# Patient Record
Sex: Male | Born: 1995
Health system: Southern US, Community
[De-identification: ages and names within clinical notes are randomized; demographics above are authoritative.]

---
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
## 2016-04-19 ENCOUNTER — Ambulatory Visit (INDEPENDENT_AMBULATORY_CARE_PROVIDER_SITE_OTHER): Payer: 59 | Admitting: Family Medicine

## 2016-04-19 ENCOUNTER — Encounter: Payer: Self-pay | Admitting: Family Medicine

## 2016-04-19 VITALS — BP 124/82 | Wt 212.2 lb

## 2016-04-19 DIAGNOSIS — Z0000 Encounter for general adult medical examination without abnormal findings: Secondary | ICD-10-CM

## 2016-04-19 NOTE — Progress Notes (Signed)
   Subjective:    Patient ID: Henry Hughes, male    DOB: 1995/07/31, 20 y.o.   MRN: [id number]  HPI   [place] saw dr Gerarda Fraction in the past  Pt working on college, graduated over the summer  Pt thinking about the [employer] or [employer]  Pt has family in the [employer]  Pt gets bad seasonal allergies  Into spring and into fall, when eweather changing wrse  Has not used inhaler for years, not a flu shot [employer]  [facility] [place] [facility] [employer] three to four times per week, does cardio   No h s sports   States diet not the greatest  Not really the best Nonsmoker No alcohol intake     Patient arrives to get established. Patient reports no concerns or problems.  Review of Systems  All other systems reviewed and are negative.      Objective:   Physical Exam  Constitutional: He appears well-developed and well-nourished.  Patient is mildly overweight  HENT:  Head: Normocephalic and atraumatic.  Right Ear: External ear normal.  Left Ear: External ear normal.  Nose: Nose normal.  Mouth/Throat: Oropharynx is clear and moist.  Eyes: EOM are normal. Pupils are equal, round, and reactive to light.  Neck: Normal range of motion. Neck supple. No thyromegaly present.  Cardiovascular: Normal rate, regular rhythm and normal heart sounds.   No murmur heard. Pulmonary/Chest: Effort normal and breath sounds normal. No respiratory distress. He has no wheezes.  Abdominal: Soft. Bowel sounds are normal. He exhibits no distension and no mass. There is no tenderness.  Genitourinary: Penis normal.  Musculoskeletal: Normal range of motion. He exhibits no edema.  Lymphadenopathy:    He has no cervical adenopathy.  Neurological: He is alert. He exhibits normal muscle tone.  Skin: Skin is warm and dry. No erythema.  Psychiatric: He has a normal mood and affect. His behavior is normal. Judgment normal.  Vitals reviewed.         Assessment & Plan:  Impression 1 well adult exam #2  overweight discussed including diet and exercise #3 allergic rhinitis with use of Zyrtec when necessary handling symptoms well #4 history of childhood asthma has not required inhaler for years plan patient declines flu shot. Diet and exercise discussed. Old records. WSL

## 2017-02-24 ENCOUNTER — Ambulatory Visit (INDEPENDENT_AMBULATORY_CARE_PROVIDER_SITE_OTHER): Payer: 59 | Admitting: Nurse Practitioner

## 2017-02-24 ENCOUNTER — Encounter: Payer: Self-pay | Admitting: Family Medicine

## 2017-02-24 ENCOUNTER — Encounter: Payer: Self-pay | Admitting: Nurse Practitioner

## 2017-02-24 VITALS — BP 130/68 | Temp 98.4°F | Ht 67.0 in | Wt 206.0 lb

## 2017-02-24 DIAGNOSIS — B369 Superficial mycosis, unspecified: Secondary | ICD-10-CM | POA: Diagnosis not present

## 2017-02-24 DIAGNOSIS — J029 Acute pharyngitis, unspecified: Secondary | ICD-10-CM

## 2017-02-24 LAB — POCT RAPID STREP A (OFFICE): Rapid Strep A Screen: NEGATIVE

## 2017-02-24 MED ORDER — KETOCONAZOLE 2 % EX CREA: 1.0000 "application " | TOPICAL_CREAM | Freq: Two times a day (BID) | CUTANEOUS | 4 refills | Status: DC

## 2017-02-24 MED ORDER — AZITHROMYCIN 250 MG PO TABS: ORAL_TABLET | ORAL | 0 refills | Status: DC

## 2017-02-25 ENCOUNTER — Encounter: Payer: Self-pay | Admitting: Nurse Practitioner

## 2017-02-25 LAB — PLEASE NOTE

## 2017-02-25 LAB — STREP A DNA PROBE: STREP GP A DIRECT, DNA PROBE: NEGATIVE

## 2017-02-25 NOTE — Progress Notes (Signed)
Subjective:  Presents for c/o white areas on the throat first noticed this am. Sore throat. No fever. No N/V. No urinary symptoms. Taking fluids well. No unusual fatigue. No abdominal pain. Has a slight rash in the mid arm area and axillary bilat. Has applied OTC hydrocortisone cream. No mouth or tongue lesions.   Objective:   BP 130/68   Temp 98.4 F (36.9 C) (Oral)   Ht 5\' 7"  (1.702 m)   Wt 206 lb (93.4 kg)   BMI 32.26 kg/m  NAD. Alert, oriented. TMs mild clear effusion. Pharynx mild erythema with multiple small pockets of exudate posterior. Neck supple with mild anterior adenopathy. No posterior adenopathy. Lungs clear. Heart RRR. Abdomen soft, non distended, non tender. No obvious splenomegaly. Dark pink minimally raised well defined patches noted in the flexor surfaces antecubital area. Pink papular rash along the outer part of the right axillary area with that fades toward the center consistent with fungal infection. Smaller area on the left.   Assessment:  Exudative pharyngitis - Plan: Strep A DNA probe, POCT rapid strep A  Fungal skin infection    Plan:   Meds ordered this encounter  Medications  . ketoconazole (NIZORAL) 2 % cream    Sig: Apply 1 application topically 2 (two) times daily.    Dispense:  30 g    Refill:  4    Order Specific Question:   Supervising Provider    Answer:   Mikey Kirschner [[id number]]  . azithromycin (ZITHROMAX Z-PAK) 250 MG tablet    Sig: Take 2 tablets (500 mg) on  Day 1,  followed by 1 tablet (250 mg) once daily on Days 2 through 5.    Dispense:  6 each    Refill:  0    Order Specific Question:   Supervising Provider    Answer:   Mikey Kirschner [[id number]]   Continue HC cream. Add ketoconazole. Warning sings reviewed. Call back in 72 hours if no improvement, sooner if worse.

## 2017-03-01 ENCOUNTER — Other Ambulatory Visit: Payer: Self-pay | Admitting: Nurse Practitioner

## 2017-03-01 MED ORDER — CEFDINIR 300 MG PO CAPS: 300.0000 mg | ORAL_CAPSULE | Freq: Two times a day (BID) | ORAL | 0 refills | Status: DC

## 2017-03-22 ENCOUNTER — Ambulatory Visit: Payer: 59 | Admitting: Nurse Practitioner

## 2017-03-24 ENCOUNTER — Encounter: Payer: Self-pay | Admitting: Family Medicine

## 2017-03-24 ENCOUNTER — Ambulatory Visit (INDEPENDENT_AMBULATORY_CARE_PROVIDER_SITE_OTHER): Payer: 59 | Admitting: Family Medicine

## 2017-03-24 VITALS — Temp 98.2°F | Ht 67.0 in | Wt 205.6 lb

## 2017-03-24 DIAGNOSIS — J029 Acute pharyngitis, unspecified: Secondary | ICD-10-CM | POA: Diagnosis not present

## 2017-03-24 MED ORDER — CLARITHROMYCIN 500 MG PO TABS: 500.0000 mg | ORAL_TABLET | Freq: Two times a day (BID) | ORAL | 0 refills | Status: DC

## 2017-03-24 MED ORDER — MAGIC MOUTHWASH: 15.0000 mL | Freq: Four times a day (QID) | ORAL | 0 refills | Status: DC

## 2017-03-24 NOTE — Progress Notes (Signed)
   Subjective:    Patient ID: Henry Hughes, male    DOB: [birth year], 21 y.o.   MRN: [id number]  HPI Patient arrives with continued sore throat for a month.  Patient was seen 02/24/17 and was strep negative. Patient states he has been on 2 different antibiotics and it has not helped his symptoms.  zpk didn't seem to help any  Was started on another abx  Then stopped for awhile, and then resumed it  Does not smoke   Going to [employer]   Has not missed work ongoing      Review of Systems     Objective:   Physical Exam        Assessment & Plan:

## 2017-03-24 NOTE — Addendum Note (Signed)
Addended by: Dairl Ponder on: 03/24/2017 02:58 PM   Modules accepted: Orders

## 2017-03-31 ENCOUNTER — Encounter: Payer: Self-pay | Admitting: Family Medicine

## 2017-07-13 ENCOUNTER — Telehealth: Payer: Self-pay | Admitting: Family Medicine

## 2017-07-13 NOTE — Telephone Encounter (Signed)
Patient is requesting order for labs.  He said Dr. Richardson Landry wanted him to have this done.

## 2017-07-13 NOTE — Telephone Encounter (Signed)
good

## 2017-07-13 NOTE — Telephone Encounter (Signed)
Left message to return call to get more info and what type of bloodwork he was requesting.  Pt last seen 10/19 for sore throat and has not had labs done since epic

## 2017-07-13 NOTE — Telephone Encounter (Signed)
Pt states his tonsils are inflammed and throat is red. Had this same problem in 0ct and dr Richardson Landry wanted to do bloodwork then but pt declined. Pt is now wanting to do bloodwork. Advised pt that he needs office visit. appt made for tomorrow.

## 2017-07-14 ENCOUNTER — Ambulatory Visit (INDEPENDENT_AMBULATORY_CARE_PROVIDER_SITE_OTHER): Payer: Self-pay | Admitting: Family Medicine

## 2017-07-14 ENCOUNTER — Encounter: Payer: Self-pay | Admitting: Family Medicine

## 2017-07-14 VITALS — Temp 97.6°F | Ht 67.0 in | Wt 205.0 lb

## 2017-07-14 DIAGNOSIS — M79601 Pain in right arm: Secondary | ICD-10-CM

## 2017-07-14 DIAGNOSIS — J351 Hypertrophy of tonsils: Secondary | ICD-10-CM

## 2017-07-14 NOTE — Progress Notes (Signed)
   Subjective:    Patient ID: Henry Hughes, male    DOB: Dec 11, 1995, 22 y.o.   MRN: [id number]  HPI Patient arrives with continued swollen tonsils since October. Patient has tried antibiotics  Took last tound of abx, semed to get etter  Throat still looks different , but pt overall feels better  Pt has girlfriend with similar throat problem.  He wonders if he may have an infection.  No pain no fever no trouble swallowing no difficulties just concerned about the appearance of his tonsils.  States they have looked this way only in the past year  Right arm pain right lateral arm worse with certain motions doing a lot of weight lifting  Review of Systems No headache, no major weight loss or weight gain, no chest pain no back pain abdominal pain no change in bowel habits complete ROS otherwise negative     Objective:   Physical Exam  Alert vitals stable, NAD. Blood pressure good on repeat. HEENT normal. Lungs clear. Heart regular rate and rhythm. Texture of tonsils bilateral some probable irregularity but definitely within normal right elbow right wrist within normal limits some lateral forearm tenderness to deep palpation     Assessment & Plan:  Impression 1 bilateral tonsillar mild hypertrophy with normal texture discussed recommend no further testing patient agrees  2.  Right arm muscle strain symptom care discussed

## 2017-09-11 ENCOUNTER — Ambulatory Visit (INDEPENDENT_AMBULATORY_CARE_PROVIDER_SITE_OTHER): Payer: Self-pay | Admitting: Nurse Practitioner

## 2017-09-11 ENCOUNTER — Encounter: Payer: Self-pay | Admitting: Nurse Practitioner

## 2017-09-11 VITALS — BP 138/80 | Temp 98.4°F | Ht 67.0 in | Wt 190.0 lb

## 2017-09-11 DIAGNOSIS — Z113 Encounter for screening for infections with a predominantly sexual mode of transmission: Secondary | ICD-10-CM

## 2017-09-11 DIAGNOSIS — B370 Candidal stomatitis: Secondary | ICD-10-CM

## 2017-09-11 DIAGNOSIS — J029 Acute pharyngitis, unspecified: Secondary | ICD-10-CM

## 2017-09-11 LAB — POCT RAPID STREP A (OFFICE): RAPID STREP A SCREEN: NEGATIVE

## 2017-09-11 MED ORDER — FLUCONAZOLE 100 MG PO TABS: ORAL_TABLET | ORAL | 0 refills | Status: DC

## 2017-09-11 NOTE — Progress Notes (Signed)
Subjective: Presents for complaints of sore gums that began about a week ago.  At first areas were blood red on the top and the bottom, have greatly improved.  No longer tender. No fever.  States he had some head congestion and felt bad about 3 days ago but this has improved.  No sore throat headache runny nose cough or ear pain.  Taking fluids well.  Voiding normal limit.  Has had some sore throats in the past, was prescribed Magic mouthwash in October.  Was treated for exudative pharyngitis on 9/20 1/18 and 03/24/17 and seen for enlarged tonsils on 07/14/2017.  Patient also requesting urine screening for STDs.  Defers blood work for STDs at this time.  Also has some faint irritation on the penis.  Objective:   BP 138/80   Temp 98.4 F (36.9 C) (Oral)   Ht 5\' 7"  (1.702 m)   Wt 190 lb (86.2 kg)   BMI 29.76 kg/m  NAD.  Alert, oriented.  TMs normal limit.  Posterior pharynx clear.  Tongue has a white film covering it.  There is no erythema of the upper and lower gums but faint white areas with small white papules.  Nontender.  Neck supple with mild soft anterior adenopathy.  Lungs clear.  Heart regular rate and rhythm.  Very faint pink nonraised dry areas on localized areas on the lateral part of the penis bilaterally.  No evidence of any rash or wound. Results for orders placed or performed in visit on 09/11/17  POCT rapid strep A  Result Value Ref Range   Rapid Strep A Screen Negative Negative     Assessment:  Sore throat - Plan: Strep A DNA probe, POCT rapid strep A  Screen for STD (sexually transmitted disease) - Plan: Chlamydia/Gonococcus/Trichomonas, NAA  Oral candidiasis    Plan:   Meds ordered this encounter  Medications  . fluconazole (DIFLUCAN) 100 MG tablet    Sig: Take 2 today then one po qd x 14 d    Dispense:  16 tablet    Refill:  0    Order Specific Question:   Supervising Provider    Answer:   Mikey Kirschner [[id number]]   Call back in 7-10 days if no improvement in his  oral symptoms, sooner if worse.  Continue OTC topicals for any irritation in the GU area, most likely from sweating.  Discussed safe sex issues.  Throat culture pending.  Further follow-up based on test results.

## 2017-09-12 LAB — CHLAMYDIA/GONOCOCCUS/TRICHOMONAS, NAA
Chlamydia by NAA: NEGATIVE
GONOCOCCUS BY NAA: NEGATIVE
TRICH VAG BY NAA: NEGATIVE

## 2017-09-12 LAB — SPECIMEN STATUS REPORT

## 2017-09-12 LAB — STREP A DNA PROBE: Strep Gp A Direct, DNA Probe: NEGATIVE

## 2017-09-15 ENCOUNTER — Other Ambulatory Visit: Payer: Self-pay | Admitting: Nurse Practitioner

## 2017-09-15 ENCOUNTER — Telehealth: Payer: Self-pay | Admitting: Nurse Practitioner

## 2017-09-15 DIAGNOSIS — Z113 Encounter for screening for infections with a predominantly sexual mode of transmission: Secondary | ICD-10-CM

## 2017-09-15 NOTE — Telephone Encounter (Signed)
Pt contacted office. He was seen on 09/11/17 and had STD testing done that came back negative. Was told to call back if mouth symptoms persist. Pt called today to let us know that he is still having mouth symptoms and would like to have a blood test done to confirm negative STD status. Please advise.

## 2017-09-15 NOTE — Telephone Encounter (Signed)
STD blood testing ordered. Call back next week if no improvement in oral symptoms.

## 2017-09-15 NOTE — Telephone Encounter (Signed)
Patient notified

## 2017-09-16 LAB — HIV ANTIBODY (ROUTINE TESTING W REFLEX): HIV Screen 4th Generation wRfx: NONREACTIVE

## 2017-09-16 LAB — HEPATITIS C ANTIBODY

## 2017-09-16 LAB — RPR: RPR: NONREACTIVE

## 2017-09-18 ENCOUNTER — Telehealth: Payer: Self-pay | Admitting: *Deleted

## 2017-09-18 DIAGNOSIS — Z113 Encounter for screening for infections with a predominantly sexual mode of transmission: Secondary | ICD-10-CM

## 2017-09-18 NOTE — Telephone Encounter (Signed)
Make sure he wants the one for type 2 genital herpes. If so, please order. Thanks.

## 2017-09-18 NOTE — Telephone Encounter (Signed)
Called pt with std testing results. He states his main concern was getting test for herpes. Wants to know if he can have a blood test for herpes added.

## 2017-09-18 NOTE — Telephone Encounter (Signed)
Called lab and had the test added. Lab states they will send over fax to sign and will pull specimen to see if they have enough to run test and let us know if not. Pt notified and lab states it will take about 4 days to get results.

## 2017-10-02 LAB — SPECIMEN STATUS REPORT

## 2017-10-03 LAB — HSV(HERPES SIMPLEX VRS) I + II AB-IGG: HSV 1 Glycoprotein G Ab, IgG: <0.91 index (ref 0.00–0.90)

## 2017-10-03 LAB — SPECIMEN STATUS REPORT

## 2017-10-16 ENCOUNTER — Telehealth: Payer: Self-pay | Admitting: Nurse Practitioner

## 2017-10-16 NOTE — Telephone Encounter (Signed)
Patient said he saw Henry Hughes on 09/11/17 due to his gums being inflamed.  He said the diflucan that was called in didn't really help and they are inflamed again.  He is requesting a different medication called in.  [place]

## 2017-10-20 ENCOUNTER — Ambulatory Visit (INDEPENDENT_AMBULATORY_CARE_PROVIDER_SITE_OTHER): Payer: Self-pay | Admitting: Family Medicine

## 2017-10-20 ENCOUNTER — Encounter: Payer: Self-pay | Admitting: Family Medicine

## 2017-10-20 VITALS — BP 130/90 | Temp 98.3°F | Ht 67.0 in | Wt 190.1 lb

## 2017-10-20 DIAGNOSIS — K0510 Chronic gingivitis, plaque induced: Secondary | ICD-10-CM

## 2017-10-20 MED ORDER — PENICILLIN V POTASSIUM 500 MG PO TABS: ORAL_TABLET | ORAL | 0 refills | Status: DC

## 2017-10-20 NOTE — Telephone Encounter (Signed)
Pt has not been vaping. He wanted to schedule recheck for today. Transferred to the front to schedule.

## 2017-10-20 NOTE — Progress Notes (Signed)
   Subjective:    Patient ID: Henry Hughes, male    DOB: 03/07/1996, 22 y.o.   MRN: [id number]  HPI  Patient is here today with complaints of gum inflammation on going for a month now. Has been given antibx and it is not seeming to help. It does not hurt. He states he has upped his dental care to brushing several times per day and flossing several time per day and nothing seems to be helping.He has been using peroxide also.Has not seen a dentist and is using a regular toothbrush.  Full record reviewed in presence of patient.  Had an aggressive teeth cleaning after years of not seeing the dentist.  Within a week and developed an impressive oral gingivostomatitis.  Chrys Racer.  Magic mouthwash.  Had a round of anti-fungal medications.  Developed anxiety regarding possible STD.  Had a fairly thorough testing all negative ongoing tenderness swollen gums with patient worried about it  Review of Systems No headache, no major weight loss or weight gain, no chest pain no back pain abdominal pain no change in bowel habits complete ROS otherwise negative Persistent inflamed gums.    Objective:   Physical Exam  Alert vitals stable, NAD. Blood pressure good on repeat. HEENT normal. Lungs clear. Heart regular rate and rhythm. Normal architecture of tongue noted.  Persistent inflamed gums      Assessment & Plan:  Impression persistent gingivitis1 long discussion held.  I think at this point a appropriate antibiotic for persistent bacterial gingivitis is all the patient needs rationale discussed and prescribed

## 2017-10-20 NOTE — Telephone Encounter (Signed)
Two things: has been doing any electronic vaping? Second, with recurrent symptoms for over a month, I recommend an office visit to recheck.

## 2017-10-20 NOTE — Telephone Encounter (Signed)
Left message to return call 

## 2018-01-08 DIAGNOSIS — M25511 Pain in right shoulder: Secondary | ICD-10-CM | POA: Diagnosis not present

## 2018-02-14 ENCOUNTER — Telehealth: Payer: Self-pay | Admitting: Family Medicine

## 2018-02-14 ENCOUNTER — Encounter: Payer: Self-pay | Admitting: Family Medicine

## 2018-02-14 ENCOUNTER — Ambulatory Visit (INDEPENDENT_AMBULATORY_CARE_PROVIDER_SITE_OTHER): Payer: 59 | Admitting: Family Medicine

## 2018-02-14 VITALS — Ht 67.0 in | Wt 194.0 lb

## 2018-02-14 DIAGNOSIS — R21 Rash and other nonspecific skin eruption: Secondary | ICD-10-CM

## 2018-02-14 DIAGNOSIS — S43101S Unspecified dislocation of right acromioclavicular joint, sequela: Secondary | ICD-10-CM

## 2018-02-14 MED ORDER — TRIAMCINOLONE ACETONIDE 0.1 % EX CREA: 1.0000 "application " | TOPICAL_CREAM | Freq: Two times a day (BID) | CUTANEOUS | 0 refills | Status: DC

## 2018-02-14 MED ORDER — TRIAMCINOLONE ACETONIDE 0.1 % EX CREA: 1.0000 "application " | TOPICAL_CREAM | Freq: Two times a day (BID) | CUTANEOUS | 5 refills | Status: DC

## 2018-02-14 NOTE — Telephone Encounter (Signed)
This is a very complicated question and would take a substantial visit and discussion to see if his symtoms warrant medical treatment, if they do, there are meds which can help

## 2018-02-14 NOTE — Telephone Encounter (Signed)
Tried to contact patient; voicemail was full; unable to leave message

## 2018-02-14 NOTE — Telephone Encounter (Signed)
Patient came back in this afternoon to get a school note and said that he forgot to ask Dr. Richardson Landry a question.  Wanted to speak to a nurse but none was available so I gave him paper to write down his question since he doesn't have Mychart and he wrote:  "I am having trouble staying focused in daily tasks such as conversations,school lectures and studying. I just wanted to know if there was anything that could help."   [phone]

## 2018-02-14 NOTE — Progress Notes (Signed)
   Subjective:    Patient ID: Henry Hughes, male    DOB: 11-06-1995, 22 y.o.   MRN: [id number]  HPI  Patient arrives with rash on both arms.  Patient states he has had for a year but got worse over the summer.   Outdoors work, Architect sites  Exposed to [employer] a lot     Uses aquaphor not much help     rash off and on worse summer   atecub space involvement      Review of Systems No headache, no major weight loss or weight gain, no chest pain no back pain abdominal pain no change in bowel habits complete ROS otherwise negative     Objective:   Physical Exam  Alert vitals stable, NAD. Blood pressure good on repeat. HEENT normal. Lungs clear. Heart regular rate and rhythm. Right shoulder.  Good range of motion.  Some tenderness at the [facility] joint.  No palpable abnormality.  Skin contact dermatitis     Assessment & Plan:  Impression 1.  Affected area  AC joint separation.  Already seen by orthopedist.  Was told them not enough difficulty for surgery.  Exercise discussed this will take a long time to improve

## 2018-02-16 NOTE — Telephone Encounter (Signed)
Patient states he will wait on an appoinment

## 2018-02-23 ENCOUNTER — Encounter: Payer: Self-pay | Admitting: Family Medicine

## 2018-02-23 ENCOUNTER — Ambulatory Visit (INDEPENDENT_AMBULATORY_CARE_PROVIDER_SITE_OTHER): Payer: 59 | Admitting: Family Medicine

## 2018-02-23 VITALS — BP 134/84 | Ht 67.0 in | Wt 189.4 lb

## 2018-02-23 DIAGNOSIS — F900 Attention-deficit hyperactivity disorder, predominantly inattentive type: Secondary | ICD-10-CM

## 2018-02-23 MED ORDER — METHYLPHENIDATE HCL ER (OSM) 36 MG PO TBCR: EXTENDED_RELEASE_TABLET | ORAL | 0 refills | Status: DC

## 2018-02-23 NOTE — Progress Notes (Signed)
   Subjective:    Patient ID: Henry Hughes, male    DOB: November 10, 1995, 22 y.o.   MRN: [id number]  HPI Pt here today to discuss ADHD medication. Hard to focus, feels like when he is talking to someone about something he keeps having to tell himself to focus.   Pt states he has had this for a while and has tried to fix the problem himself but no luck. Pt attends [employer] currently.    Pt states he is having trouble in the classroom with focusing. 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  Looking back, pt states had middle school issues with this   After elem nticed more and more troubles with this    taled with docs in th past re add  Pt states emotions come in to play with it   At times, gets frustrated which can turn in to anger and sometimes sadness   No treatment for adhd  Mom has depression   Mood would switch immediately   Pos fam hx of depression    Two and a half yrs going forward for collet e  Had adderall in the past did not handle      Review of Systems No headache, no major weight loss or weight gain, no chest pain no back pain abdominal pain no change in bowel habits complete ROS otherwise negative     Objective:   Physical Exam  Alert and oriented, vitals reviewed and stable, NAD ENT-TM's and ext canals WNL bilat via otoscopic exam Soft palate, tonsils and post pharynx WNL via oropharyngeal exam Neck-symmetric, no masses; thyroid nonpalpable and nontender Pulmonary-no tachypnea or accessory muscle use; Clear without wheezes via auscultation Card--no abnrml murmurs, rhythm reg and rate WNL Carotid pulses symmetric, without bruits       Assessment &  Plan:  Impression adult ADHD.  Discussed at great length.  Patient scores 9 out of 9 on DSM criteria for inattention and ADHD.  No major components:  We discussed pros and cons of medications and potential choices discussed.  Patient has tried Adderall in the past and does not like we will proceed with her generic Concerta rationale discussed.  1 months worth written patient to call us back in a few weeks follow-up in several months  Greater than 50% of this 25 minute face to face visit was spent in counseling and discussion and coordination of care regarding the above diagnosis/diagnosies

## 2018-02-23 NOTE — Patient Instructions (Signed)

## 2018-03-28 ENCOUNTER — Encounter: Payer: Self-pay | Admitting: Family Medicine

## 2018-03-28 ENCOUNTER — Ambulatory Visit (INDEPENDENT_AMBULATORY_CARE_PROVIDER_SITE_OTHER): Payer: 59 | Admitting: Family Medicine

## 2018-03-28 VITALS — BP 132/78 | Ht 67.0 in | Wt 189.0 lb

## 2018-03-28 DIAGNOSIS — F900 Attention-deficit hyperactivity disorder, predominantly inattentive type: Secondary | ICD-10-CM

## 2018-03-28 MED ORDER — METHYLPHENIDATE HCL ER (OSM) 54 MG PO TBCR: 54.0000 mg | EXTENDED_RELEASE_TABLET | ORAL | 0 refills | Status: DC

## 2018-03-28 NOTE — Progress Notes (Signed)
   Subjective:    Patient ID: Henry Hughes, male    DOB: 03-20-1996, 22 y.o.   MRN: [id number]  HPI ADD check up. Takes methylphenidate 36mg  in the morning. Pt wants to discuss increasing med. Trouble focusing at work and school.     Overall the med has definietely helped  Pt notes that the med sems to be falling off in its strength  Seems like body got used to and it is less helpful  elps a little   Generic  meds  Seem to be better     Declines flu vaccine.      Review of Systems No headache, no major weight loss or weight gain, no chest pain no back pain abdominal pain no change in bowel habits complete ROS otherwise negative     Objective:   Physical Exam  Alert vitals stable, NAD. Blood pressure good on repeat. HEENT normal. Lungs clear. Heart regular rate and rhythm.       Assessment & Plan:  #1 impression ADHD.  Improved.  But still suboptimum.  Patient thinks higher dose may well be more helpful.  I think this is accurate.  Will increase to 54 mg rationale discussed.  Exercise encouraged follow-up in 4 months

## 2018-05-04 ENCOUNTER — Ambulatory Visit (INDEPENDENT_AMBULATORY_CARE_PROVIDER_SITE_OTHER): Payer: 59 | Admitting: Family Medicine

## 2018-05-04 ENCOUNTER — Encounter: Payer: Self-pay | Admitting: Family Medicine

## 2018-05-04 VITALS — BP 122/76 | Temp 97.7°F | Ht 67.0 in | Wt 191.2 lb

## 2018-05-04 DIAGNOSIS — B081 Molluscum contagiosum: Secondary | ICD-10-CM

## 2018-05-04 MED ORDER — DOXYCYCLINE HYCLATE 100 MG PO TABS: ORAL_TABLET | ORAL | 0 refills | Status: DC

## 2018-05-04 NOTE — Patient Instructions (Addendum)
Molluscum contagiosu                                                                    Molluscum Contagiosum, Adult Molluscum contagiosum is a skin infection that can cause a rash. When molluscum contagiosum affects the genital area, it is called a sexually transmitted disease (STD). What are the causes? Molluscum contagiosum is caused by a virus. The virus can spread easily from person to person through:  Skin-to-skin contact with an infected person.  Contact with an infected object, such as a towel or clothing.  What increases the risk? You may be at higher risk for molluscum contagiosum if you:  Live in an area where the weather is moist and warm.  Have a weak body defense system (immune system).  What are the signs or symptoms? The main symptom is a rash that appears 2-7 weeks after exposure to the virus. It is made up of 2-20 small, firm, dome-shaped bumps that may:  Be pink or flesh-colored.  Appear alone or in groups.  Range from the size of a pinhead to the size of a pencil eraser.  Feel smooth and waxy.  Have a pit in the middle.  Itch. The rash does not itch for most people.  The bumps often appears on the genitals, thighs, face, neck, and abdomen. How is this diagnosed? A health care provider can usually diagnose molluscum contagiosum by looking at the bumps on your skin. To confirm the diagnosis, your health care provider may scrape the bumps to collect a skin sample to examine under a microscope. How is this treated? The bumps may go away on their own, but people often have treatment to keep the virus from infecting someone else or to keep the rash from spreading to other body parts. Treatment may include:  Surgery to remove the bumps by freezing them (cryosurgery).  A procedure to scrape off the bumps (curettage).  A procedure to remove the bumps with a laser.  Putting medicine on the bumps  (topical treatment).  Sometimes no treatment is needed. Follow these instructions at home:  Take medicines only as directed by your health care provider.  As long as you have bumps on your skin, the infection can spread to others and to other parts of your body. To prevent this from happening: ? Do not scratch the bumps. ? Do not share clothing or towels with others until the bumps disappear. ? Avoid close contact with others until the bumps disappear. This includes sexual contact. ? Wash your hands often. ? Cover the bumps with clothing or a bandage when you will be near other people. Contact a health care provider if:  The bumps are spreading.  The bumps are becoming red and sore.  The bumps have not gone away after 12 months. This information is not intended to replace advice given to you by your health care provider. Make sure you discuss any questions you have with your health care provider. Document Released: 12/18/2013 Document Revised: 10/29/2015 Document Reviewed: 10/30/2013 Elsevier Interactive Patient Education  2018 [place] by a virus  m c caused by a virus   Very contagious  Treatment  These days experts say often dont do a thing  Can require a yr to go away with this pproach   There is a cream that ofte does ot help   woud rec derm referral

## 2018-05-04 NOTE — Progress Notes (Signed)
   Subjective:    Patient ID: Henry Hughes, male    DOB: 1995-07-10, 22 y.o.   MRN: [id number]  HPI Pt here today for screening for STD. Pt states he is having irritation in private area. Pt states no discharge, no itching, no burning. Pt only has bumps in private area.   Pt has used steroid cream and aloe vera. Noticed a couple weeks ago.   Came up couple weeks ago, pt a bit concerned about it  Pain free no discomfort   no itching no pain nit sick    Wondered if from shaving   Review of Systems No headache, no major weight loss or weight gain, no chest pain no back pain abdominal pain no change in bowel habits complete ROS otherwise negative     Objective:   Physical Exam  Alert vitals stable, NAD. Blood pressure good on repeat. HEENT normal. Lungs clear. Heart regular rate and rhythm. Infected folliculitis nodule anterior abdomen  Cluster of molluscum contagiosum in the groin area      Assessment & Plan:  Angry folliculitis nodule, will rx with doxy   [facility]-rx options disc pt would like to see derm to get some of these rxed in hoped of incurring immune response

## 2018-05-14 ENCOUNTER — Encounter: Payer: Self-pay | Admitting: Family Medicine

## 2018-05-25 ENCOUNTER — Ambulatory Visit: Payer: 59 | Admitting: Family Medicine

## 2018-06-18 ENCOUNTER — Telehealth: Payer: Self-pay | Admitting: Family Medicine

## 2018-06-18 MED ORDER — METHYLPHENIDATE HCL ER (OSM) 54 MG PO TBCR: 54.0000 mg | EXTENDED_RELEASE_TABLET | ORAL | 0 refills | Status: DC

## 2018-06-18 NOTE — Telephone Encounter (Signed)
Prescription sent to the pharmacy by Dr Richardson Landry. Patient notified.

## 2018-06-18 NOTE — Telephone Encounter (Signed)
Let's do 

## 2018-06-18 NOTE — Telephone Encounter (Signed)
Pt's mom is calling requesting we transfer his medication methylphenidate (CONCERTA) 54 MG PO CR tablet to [facility] in [place] or give a paper script. There insurance doesn't cover the medication and [facility] is unable to take good rx coupons. If it can be sent to [facility] it would cost the pt $80 vs $270 at [facility].   Mom is also requesting this be done today if possible pt is out of medication and starts college back tomorrow. Mom is not on DPR informed her we would call Alroy Dust once Dr. Richardson Landry replies.   CB# [phone]

## 2018-08-13 ENCOUNTER — Telehealth: Payer: Self-pay | Admitting: Family Medicine

## 2018-08-13 MED ORDER — METHYLPHENIDATE HCL ER (OSM) 54 MG PO TBCR: 54.0000 mg | EXTENDED_RELEASE_TABLET | ORAL | 0 refills | Status: DC

## 2018-08-13 NOTE — Telephone Encounter (Signed)
Medication pending and pt transferred up front to set up appt.

## 2018-08-13 NOTE — Telephone Encounter (Signed)
Please advise. Thank you

## 2018-08-13 NOTE — Telephone Encounter (Signed)
Patient is requesting refill on concerta 54 mg last filled 06/18/18 and last seen 03/28/2018 for ADD follow up.

## 2018-08-13 NOTE — Telephone Encounter (Signed)
Left message to return call to let pt know he needs office visit and to ask which pharm he wants to use for med. 2 pharms listed in chart. Then will send back to doctor for him to sign.

## 2018-08-13 NOTE — Telephone Encounter (Signed)
One mo worth needs o v before any further rx's

## 2018-08-15 ENCOUNTER — Other Ambulatory Visit: Payer: Self-pay

## 2018-08-15 ENCOUNTER — Telehealth: Payer: Self-pay | Admitting: Family Medicine

## 2018-08-15 ENCOUNTER — Encounter: Payer: Self-pay | Admitting: Family Medicine

## 2018-08-15 ENCOUNTER — Ambulatory Visit (INDEPENDENT_AMBULATORY_CARE_PROVIDER_SITE_OTHER): Payer: 59 | Admitting: Family Medicine

## 2018-08-15 VITALS — BP 130/72 | Ht 67.0 in | Wt 188.0 lb

## 2018-08-15 DIAGNOSIS — F900 Attention-deficit hyperactivity disorder, predominantly inattentive type: Secondary | ICD-10-CM | POA: Diagnosis not present

## 2018-08-15 NOTE — Patient Instructions (Signed)
Guanfacine extended-release oral tablets What is this medicine? GUANFACINE [facility] seen) is used to treat attention-deficit hyperactivity disorder (ADHD). This medicine may be used for other purposes; ask your health care provider or pharmacist if you have questions. COMMON BRAND NAME(S): Intuniv What should I tell my health care provider before I take this medicine? They need to know if you have any of these conditions: -high blood pressure -kidney disease -liver disease -low blood pressure -slow heart rate -an unusual or allergic reaction to guanfacine, other medicines, foods, dyes, or preservatives -pregnant or trying to get pregnant -breast-feeding How should I use this medicine? Take this medicine by mouth with a glass of water. Follow the directions on the prescription label. Do not cut, crush, or chew this medicine. Do not take this medicine with a high-fat meal. Take your medicine at regular intervals. Do not take it more often than directed. Do not stop taking except on your doctor's advice. Stopping this medicine too quickly may cause serious side effects. Ask your doctor or health care professional for advice. This drug may be prescribed for children as young as 6 years. Talk to your doctor if you have any questions. Overdosage: If you think you have taken too much of this medicine contact a poison control center or emergency room at once. NOTE: This medicine is only for you. Do not share this medicine with others. What if I miss a dose? If you miss a dose, take it as soon as you can. If it is almost time for your next dose, take only that dose. Do not take double or extra doses. If you miss 2 or more doses in a row, you should contact your doctor or health care professional. You may need to restart your medicine at a lower dose. What may interact with this medicine? -certain medicines for blood pressure, heart disease, irregular heart beat -certain medicines for depression,  anxiety, or psychotic disturbances -certain medicines for seizures like carbamazepine, phenobarbital, phenytoin -certain medicines for sleep -ketoconazole -narcotic medicines for pain -rifampin This list may not describe all possible interactions. Give your health care provider a list of all the medicines, herbs, non-prescription drugs, or dietary supplements you use. Also tell them if you smoke, drink alcohol, or use illegal drugs. Some items may interact with your medicine. What should I watch for while using this medicine? Visit your doctor or health care professional for regular checks on your progress. Check your heart rate and blood pressure as directed. Ask your doctor or health care professional what your heart rate and blood pressure should be and when you should contact him or her. You may get dizzy or drowsy. Do not drive, use machinery, or do anything that needs mental alertness until you know how this medicine affects you. Do not stand or sit up quickly, especially if you are an older patient. This reduces the risk of dizzy or fainting spells. Alcohol can make you more drowsy and dizzy. Avoid alcoholic drinks. Avoid becoming dehydrated or overheated while taking this medicine. Tell your healthcare provider if you have been vomiting and cannot take this medicine because you may be at risk for a sudden and large increase in blood pressure called rebound hypertension. Your mouth may get dry. Chewing sugarless gum or sucking hard candy, and drinking plenty of water may help. Contact your doctor if the problem does not go away or is severe. What side effects may I notice from receiving this medicine? Side effects that you should report  to your doctor or health care professional as soon as possible: -allergic reactions like skin rash, itching or hives, swelling of the face, lips, or tongue -changes in emotions or moods -chest pain or chest tightness -signs and symptoms of low blood pressure  like dizziness; feeling faint or lightheaded, falls; unusually weak or tired -unusually slow heartbeat Side effects that usually do not require medical attention (report to your doctor or health care professional if they continue or are bothersome): -drowsiness -dry mouth -headache -nausea -tiredness This list may not describe all possible side effects. Call your doctor for medical advice about side effects. You may report side effects to FDA at 1-800-FDA-1088. Where should I keep my medicine? Keep out of the reach of children. Store at room temperature between 15 and 30 degrees C (59 and 86 degrees F). Throw away any unused medicine after the expiration date. NOTE: This sheet is a summary. It may not cover all possible information. If you have questions about this medicine, talk to your doctor, pharmacist, or health care provider.  2019 Elsevier/Gold Standard (2016-08-30 19:38:26)

## 2018-08-15 NOTE — Telephone Encounter (Signed)
Pt called to check on Rx for Intuniv

## 2018-08-15 NOTE — Progress Notes (Signed)
   Subjective:    Patient ID: Henry Hughes, male    DOB: Aug 02, 1995, 23 y.o.   MRN: [id number]  HPI  Patient was seen today for ADD checkup.  This patient does have ADD.  Patient takes medications for this.  If this does help control overall symptoms.  Please see below. -weight, vital signs reviewed.  The following items were covered. -Compliance with medication : Methylphenidate 54 mg once daily,  -Problems with completing homework, paying attention/taking good notes in school: No  -grades: Good  - Eating patterns : Good  -sleeping: Good  -Additional issues or questions: None  attention deficit and hyperactivity disorder   Review of Systems No headache, no major weight loss or weight gain, no chest pain no back pain abdominal pain no change in bowel habits complete ROS otherwise negative     Objective:   Physical Exam Alert vitals stable, NAD. Blood pressure good on repeat. HEENT normal. Lungs clear. Heart regular rate and rhythm.  Notes not quita as effectiv . Seems to not be workig as good as the ones in the past      Assessment & Plan:  Impression ADHD.  Suboptimal control at this time options discussed.  Potential options discussed will add Intuniv rationale discussed.  Numerous answers given to numerous questions.  Follow-up in 4 months  Greater than 50% of this 25 minute face to face visit was spent in counseling and discussion and coordination of care regarding the above diagnosis/diagnosies

## 2018-08-15 NOTE — Telephone Encounter (Signed)
Pt advised that provider would need to sign script. Pt informed that it may be a little bit longer before it will be ready. Pt verbalized understanding.

## 2018-08-16 MED ORDER — METHYLPHENIDATE HCL ER (OSM) 54 MG PO TBCR: 54.0000 mg | EXTENDED_RELEASE_TABLET | ORAL | 0 refills | Status: DC

## 2018-08-16 MED ORDER — METHYLPHENIDATE HCL ER (OSM) 54 MG PO TBCR: EXTENDED_RELEASE_TABLET | ORAL | 0 refills | Status: DC

## 2018-08-16 MED ORDER — GUANFACINE HCL ER 2 MG PO TB24: ORAL_TABLET | ORAL | 5 refills | Status: DC

## 2018-12-11 ENCOUNTER — Encounter: Payer: 59 | Admitting: Family Medicine

## 2018-12-25 ENCOUNTER — Other Ambulatory Visit: Payer: Self-pay

## 2018-12-25 ENCOUNTER — Ambulatory Visit: Payer: 59 | Admitting: Family Medicine

## 2019-01-15 ENCOUNTER — Other Ambulatory Visit: Payer: Self-pay

## 2019-01-15 ENCOUNTER — Ambulatory Visit (INDEPENDENT_AMBULATORY_CARE_PROVIDER_SITE_OTHER): Payer: 59 | Admitting: Family Medicine

## 2019-01-15 DIAGNOSIS — F900 Attention-deficit hyperactivity disorder, predominantly inattentive type: Secondary | ICD-10-CM | POA: Diagnosis not present

## 2019-01-15 NOTE — Progress Notes (Signed)
   Subjective:  Audio plus video  Patient ID: Henry Hughes, male    DOB: 05/24/1996, 23 y.o.   MRN: [id number]  HPI ADD check up. Pt wants to lower dosage on concerta 54mg  because his schedule is not as busy as it usually is. Needs refill on concerta and intuniv.   Virtual Visit via Video Note  I connected with Henry Hughes on 01/15/19 at  3:00 PM EDT by a video enabled telemedicine application and verified that I am speaking with the correct person using two identifiers.  Location: Patient: home Provider: office   I discussed the limitations of evaluation and management by telemedicine and the availability of in person appointments. The patient expressed understanding and agreed to proceed.  History of Present Illness:    Observations/Objective:   Assessment and Plan:   Follow Up Instructions:    I discussed the assessment and treatment plan with the patient. The patient was provided an opportunity to ask questions and all were answered. The patient agreed with the plan and demonstrated an understanding of the instructions.   The patient was advised to call back or seek an in-person evaluation if the symptoms worsen or if the condition fails to improve as anticipated.  I provided 20 minutes of non-face-to-face time during this encounter.  With new work expectation simply not needing as much medication.  He has been able to back off from a dose with success.  Would prefer not to take any more than 27 mg daily.  Exercising regularly.  Doing well off work   Review of Systems No headache, no major weight loss or weight gain, no chest pain no back pain abdominal pain no change in bowel habits complete ROS otherwise negative     Objective:   Physical Exam  Virtual      Assessment & Plan:  Impression ADHD.  Doing well.  Will cut methylphenidate dose in half.  Diet exercise discussed and encouraged 4 months worth follow-up in

## 2019-01-16 ENCOUNTER — Encounter: Payer: Self-pay | Admitting: Family Medicine

## 2019-01-16 MED ORDER — METHYLPHENIDATE HCL ER (OSM) 27 MG PO TBCR: 27.0000 mg | EXTENDED_RELEASE_TABLET | ORAL | 0 refills | Status: DC

## 2019-01-16 MED ORDER — METHYLPHENIDATE HCL ER (OSM) 27 MG PO TBCR: EXTENDED_RELEASE_TABLET | ORAL | 0 refills | Status: DC

## 2019-01-17 ENCOUNTER — Other Ambulatory Visit: Payer: Self-pay | Admitting: Family Medicine

## 2019-01-17 MED ORDER — METHYLPHENIDATE HCL ER (OSM) 27 MG PO TBCR: 27.0000 mg | EXTENDED_RELEASE_TABLET | ORAL | 0 refills | Status: DC

## 2019-01-17 NOTE — Telephone Encounter (Signed)
Henry Hughes with [facility] pharmacy contacted office and stated that pts Concerta scripts came over but they are only allowed to have 3 C2 scripts on file. When the orders came over, the script that can be filled today was deactivated. Henry Hughes would like to know if we could send over a new script of the Concerta 27 mg stating that it can be filled today. Please advise. Thank you

## 2019-01-17 NOTE — Addendum Note (Signed)
Addended by: Mikey Kirschner on: 01/17/2019 09:33 AM   Modules accepted: Orders

## 2019-01-17 NOTE — Telephone Encounter (Signed)
Sure, but they should deactivate the fourth not the first one, redo plz

## 2019-01-17 NOTE — Addendum Note (Signed)
Addended by: Vicente Males on: 01/17/2019 09:29 AM   Modules accepted: Orders

## 2019-06-26 ENCOUNTER — Other Ambulatory Visit: Payer: Self-pay

## 2019-06-26 ENCOUNTER — Ambulatory Visit (INDEPENDENT_AMBULATORY_CARE_PROVIDER_SITE_OTHER): Payer: 59 | Admitting: Family Medicine

## 2019-06-26 DIAGNOSIS — F900 Attention-deficit hyperactivity disorder, predominantly inattentive type: Secondary | ICD-10-CM

## 2019-06-26 MED ORDER — CONCERTA 36 MG PO TBCR: 36.0000 mg | EXTENDED_RELEASE_TABLET | Freq: Every day | ORAL | 0 refills | Status: DC

## 2019-06-26 NOTE — Progress Notes (Signed)
   Subjective:    Patient ID: Henry Hughes, male    DOB: [birth year], 24 y.o.   MRN: [id number]  HPI Patient was seen today for ADD checkup.  This patient does have ADD.  Patient takes medications for this.  If this does help control overall symptoms.  Please see below. -weight, vital signs reviewed.  The following items were covered. -Compliance with medication : Concerta 37 mg  -Problems with completing homework, paying attention/taking good notes in school: does ok when pt takes meds but if he misses a dose or is out he can tell  -grades: OK  - Eating patterns : no issues  -sleeping: no issues  -Additional issues or questions: pt states that he can feel the anger coming back. Pt states he spoke with provider a while ago and his ADD dose was lowered. Pt believes he is having some anxiety and would like to know if meds would be helpful.[name] Kitchen GAD 7 and PHQ 9 completed.  Virtual Visit via Telephone Note  I connected with [name] on 06/26/19 at 10:00 AM EST by telephone and verified that I am speaking with the correct person using two identifiers.  Location: Patient: home Provider: office   I discussed the limitations, risks, security and privacy concerns of performing an evaluation and management service by telephone and the availability of in person appointments. I also discussed with the patient that there may be a patient responsible charge related to this service. The patient expressed understanding and agreed to proceed.   History of Present Illness:    Observations/Objective:   Assessment and Plan:   Follow Up Instructions:    I discussed the assessment and treatment plan with the patient. The patient was provided an opportunity to ask questions and all were answered. The patient agreed with the plan and demonstrated an understanding of the instructions.   The patient was advised to call back or seek an in-person evaluation if the symptoms worsen or if the  condition fails to improve as anticipated.  I provided 20 minutes of non-face-to-face time during this encounter.   Patient has aspects of both anxiety and depression.  This is discussed today.  Patient states this is been a long-term issue.  Wonders if he would benefit from medications.  Notes irritability.  Notes substantial anxiety.  Periods of feeling down.  No suicidal thoughts.  Wonders if medication may help.  Somewhat suboptimal control of ADHD.  Feels he would benefit from an elevated dose.  Review of Systems No headache, no major weight loss or weight gain, no chest pain no back pain abdominal pain no change in bowel habits complete ROS otherwise negative     Objective:   Physical Exam  Virtual      Assessment & Plan:  Impression ADHD.  Suboptimal control discussed we will increase Concerta to 36.  Patient initially requested brand name but after learning it would cost him $400 changes request to generic  2.  Generalized anxiety with element of depression and irritability.  Discussed at length.  Fairly severe to patient.  Affecting his ability to do what he would like to do and needs to do.  Would like to consider medication.  Would recommend reevaluation for discussion of medicines side effects benefits dosages etc.

## 2019-06-27 ENCOUNTER — Telehealth: Payer: Self-pay | Admitting: Family Medicine

## 2019-06-27 MED ORDER — METHYLPHENIDATE HCL ER (OSM) 36 MG PO TBCR: 36.0000 mg | EXTENDED_RELEASE_TABLET | Freq: Every day | ORAL | 0 refills | Status: DC

## 2019-06-27 NOTE — Telephone Encounter (Signed)
Yesterday pt insisted on nongeneric? Nurses call, and clarify, andhonor this pt's latest request?!

## 2019-06-27 NOTE — Telephone Encounter (Signed)
Pt had appt yesterday and had CONCERTA 36 MG CR tablet sent to pharmacy. He needs the generic called in please.

## 2019-07-02 ENCOUNTER — Ambulatory Visit: Payer: 59 | Admitting: Family Medicine

## 2019-07-03 ENCOUNTER — Telehealth: Payer: Self-pay | Admitting: *Deleted

## 2019-07-03 ENCOUNTER — Ambulatory Visit (INDEPENDENT_AMBULATORY_CARE_PROVIDER_SITE_OTHER): Payer: 59 | Admitting: Family Medicine

## 2019-07-03 ENCOUNTER — Encounter: Payer: Self-pay | Admitting: Family Medicine

## 2019-07-03 DIAGNOSIS — F411 Generalized anxiety disorder: Secondary | ICD-10-CM | POA: Diagnosis not present

## 2019-07-03 DIAGNOSIS — F900 Attention-deficit hyperactivity disorder, predominantly inattentive type: Secondary | ICD-10-CM

## 2019-07-03 MED ORDER — ESCITALOPRAM OXALATE 10 MG PO TABS: ORAL_TABLET | ORAL | 3 refills | Status: DC

## 2019-07-03 NOTE — Telephone Encounter (Signed)
Please call and schedule pt a follow up with dr Richardson Landry in mid april

## 2019-07-03 NOTE — Progress Notes (Signed)
   Subjective:  Audio  Patient ID: Henry Hughes, male    DOB: 1996/06/01, 24 y.o.   MRN: [id number]  HPIpt states he has been dealing with anxiety for his whole life.  GAD 7 and phq9 done. Pt did mention when going through the questions on phq9 that he has had thoughts of hurting himself but states he does not feel that way today.   Virtual Visit via Telephone Note  I connected with Henry Hughes on 07/03/19 at 11:00 AM EST by telephone and verified that I am speaking with the correct person using two identifiers.  Location: Patient: home Provider: office   I discussed the limitations, risks, security and privacy concerns of performing an evaluation and management service by telephone and the availability of in person appointments. I also discussed with the patient that there may be a patient responsible charge related to this service. The patient expressed understanding and agreed to proceed.   History of Present Illness:    Observations/Objective:   Assessment and Plan:   Follow Up Instructions:    I discussed the assessment and treatment plan with the patient. The patient was provided an opportunity to ask questions and all were answered. The patient agreed with the plan and demonstrated an understanding of the instructions.   The patient was advised to call back or seek an in-person evaluation if the symptoms worsen or if the condition fails to improve as anticipated.  I provided 25 minutes of non-face-to-face time during this encounter.    Long discussion held.  Patient has substantial challenges with generalized anxiety disorder.  Also an element of depression at times.  Definitely affects his ability to do things he wants to do both  Positive family history of tendency in this direction  See GAD-7 PHQ-9  Review of Systems No headache no chest pain no shortness of breath    Objective:   Physical Exam   Virtual     Assessment & Plan:  Impression  generalized anxiety disorder.  Long discussion held regarding options.  Patient would like to try a serotonin reuptake inhibitor.  Will initiate Lexapro 10 mg follow-up as scheduled

## 2019-07-04 ENCOUNTER — Encounter: Payer: Self-pay | Admitting: Family Medicine

## 2019-07-31 NOTE — Telephone Encounter (Signed)
Scheduled appointment for 4/13

## 2019-09-17 ENCOUNTER — Ambulatory Visit (INDEPENDENT_AMBULATORY_CARE_PROVIDER_SITE_OTHER): Payer: 59 | Admitting: Family Medicine

## 2019-09-17 ENCOUNTER — Other Ambulatory Visit: Payer: Self-pay

## 2019-09-17 DIAGNOSIS — F900 Attention-deficit hyperactivity disorder, predominantly inattentive type: Secondary | ICD-10-CM

## 2019-09-17 DIAGNOSIS — F411 Generalized anxiety disorder: Secondary | ICD-10-CM

## 2019-09-17 MED ORDER — ESCITALOPRAM OXALATE 20 MG PO TABS: ORAL_TABLET | ORAL | 5 refills | Status: DC

## 2019-09-17 MED ORDER — METHYLPHENIDATE HCL ER (OSM) 54 MG PO TBCR: EXTENDED_RELEASE_TABLET | ORAL | 0 refills | Status: DC

## 2019-09-17 NOTE — Progress Notes (Signed)
   Subjective:  Audio only  Patient ID: Henry Hughes, male    DOB: August 02, 1995, 24 y.o.   MRN: [id number]  HPI Patient was seen today for ADD checkup.  This patient does have ADD.  Patient takes medications for this.  If this does help control overall symptoms.  Please see below. -weight, vital signs reviewed.  The following items were covered. -Compliance with medication : Concerta 36 mg each day  -Problems with completing homework, paying attention/taking good notes in school: having some issues  -grades: OK but could be better  - Eating patterns : eats well  -sleeping: no issues with sleeping  -Additional issues or questions: pt would like to increase does; about mid day pt states he has a crash.  Virtual Visit via Telephone Note  I connected with Henry Hughes on 09/17/19 at  9:00 AM EDT by telephone and verified that I am speaking with the correct person using two identifiers.  Location: Patient: home Provider: office   I discussed the limitations, risks, security and privacy concerns of performing an evaluation and management service by telephone and the availability of in person appointments. I also discussed with the patient that there may be a patient responsible charge related to this service. The patient expressed understanding and agreed to proceed.   History of Present Illness:    Observations/Objective:   Assessment and Plan:   Follow Up Instructions:    I discussed the assessment and treatment plan with the patient. The patient was provided an opportunity to ask questions and all were answered. The patient agreed with the plan and demonstrated an understanding of the instructions.   The patient was advised to call back or seek an in-person evaluation if the symptoms worsen or if the condition fails to improve as anticipated.  I provided 20 minutes of non-face-to-face time during this encounter.  Attention falls off around mid day  Patient  reports his attention is not doing as well.  Fades away soon after lunch.  Medication seems to be wearing off them.  Also notes anxiety is higher level currently.  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  No significant depression   Review of Systems No headache no chest pain no shortness of breath    Objective:   Physical Exam  Virtual      Assessment & Plan:  Impression 1 ADHD.  Suboptimal control discussed we will increase to 54 mg  2.  Generalized anxiety disorder.  Also suboptimal control.  Discussed.  Increase Lexapro to 20  Exercise also encouraged.  Follow-up in 3 months.

## 2019-12-16 ENCOUNTER — Ambulatory Visit (INDEPENDENT_AMBULATORY_CARE_PROVIDER_SITE_OTHER): Payer: 59 | Admitting: Family Medicine

## 2019-12-16 ENCOUNTER — Encounter: Payer: Self-pay | Admitting: Family Medicine

## 2019-12-16 ENCOUNTER — Other Ambulatory Visit: Payer: Self-pay

## 2019-12-16 VITALS — HR 85 | Temp 101.0°F

## 2019-12-16 DIAGNOSIS — J029 Acute pharyngitis, unspecified: Secondary | ICD-10-CM | POA: Diagnosis not present

## 2019-12-16 LAB — POCT RAPID STREP A (OFFICE): Rapid Strep A Screen: POSITIVE — AB

## 2019-12-16 MED ORDER — AMOXICILLIN 500 MG PO CAPS: 500.0000 mg | ORAL_CAPSULE | Freq: Three times a day (TID) | ORAL | 0 refills | Status: DC

## 2019-12-16 MED ORDER — METHYLPHENIDATE HCL ER (OSM) 54 MG PO TBCR: EXTENDED_RELEASE_TABLET | ORAL | 0 refills | Status: DC

## 2019-12-16 NOTE — Progress Notes (Signed)
Patient ID: Henry Hughes, male    DOB: [birth year], 24 y.o.   MRN: [id number]   Chief Complaint  Patient presents with  . Sore Throat   Subjective:    HPI Pt having throat pain since 3 days ago. Lymph nodes swollen 3 days ago, could not swallow anything, throat inflamed with white areas. Pt had fever over the weekend. Headache, cold chills, sweats, weakness. Pt has tried cough drops and Dayquil. Some relief. Pt is worried about being contagious.  Had both covid vaccines. Working outside in heat this week.   Medical History Henry Hughes has no past medical history on file.   Outpatient Encounter Medications as of 12/16/2019  Medication Sig  . escitalopram (LEXAPRO) 20 MG tablet Take one qam  . methylphenidate 54 MG PO CR tablet Take one tablet po daily  . methylphenidate 54 MG PO CR tablet Take one tablet po  daily  . methylphenidate 54 MG PO CR tablet Take one tablet po daily  . [DISCONTINUED] Fexofenadine HCl (ALLEGRA ALLERGY PO) Take by mouth.  . [DISCONTINUED] methylphenidate 54 MG PO CR tablet Take one tablet po daily  . [DISCONTINUED] methylphenidate 54 MG PO CR tablet Take one tablet po  daily  . [DISCONTINUED] methylphenidate 54 MG PO CR tablet Take one tablet po daily  . amoxicillin (AMOXIL) 500 MG capsule Take 1 capsule (500 mg total) by mouth 3 (three) times daily.  . [DISCONTINUED] cetirizine (ZYRTEC) 10 MG tablet Take 10 mg by mouth daily.  . [DISCONTINUED] methylphenidate (CONCERTA) 36 MG PO CR tablet Take 1 tablet (36 mg total) by mouth daily.   No facility-administered encounter medications on file as of 12/16/2019.     Review of Systems  Constitutional: Positive for chills and fever.  HENT: Positive for sore throat. Negative for congestion, ear pain, postnasal drip, rhinorrhea, sinus pressure and sinus pain.   Respiratory: Positive for cough. Negative for shortness of breath and wheezing.   Cardiovascular: Negative for chest pain and leg swelling.    Gastrointestinal: Negative for abdominal pain, diarrhea, nausea and vomiting.  Genitourinary: Negative for dysuria and frequency.  Skin: Negative for rash.  Neurological: Positive for weakness. Negative for dizziness and headaches.     Vitals Pulse 85   Temp (!) 101 F (38.3 C) (Temporal)   SpO2 98%   Objective:   Physical Exam Vitals and nursing note reviewed.  Constitutional:      General: He is not in acute distress.    Appearance: Normal appearance. He is not ill-appearing.  HENT:     Head: Normocephalic.     Nose: Nose normal. No congestion.     Mouth/Throat:     Mouth: Mucous membranes are moist.     Pharynx: Uvula midline. Oropharyngeal exudate and posterior oropharyngeal erythema present. No uvula swelling.     Tonsils: Tonsillar exudate present. No tonsillar abscesses.  Eyes:     Extraocular Movements: Extraocular movements intact.     Conjunctiva/sclera: Conjunctivae normal.     Pupils: Pupils are equal, round, and reactive to light.  Cardiovascular:     Rate and Rhythm: Normal rate and regular rhythm.     Pulses: Normal pulses.  Pulmonary:     Effort: Pulmonary effort is normal.     Breath sounds: Normal breath sounds. No wheezing, rhonchi or rales.  Musculoskeletal:        General: Normal range of motion.     Cervical back: Neck supple.     Right lower leg: No  edema.     Left lower leg: No edema.  Lymphadenopathy:     Cervical: Cervical adenopathy present.  Skin:    General: Skin is warm and dry.     Findings: No rash.  Neurological:     General: No focal deficit present.     Mental Status: He is alert and oriented to person, place, and time.     Cranial Nerves: No cranial nerve deficit.  Psychiatric:        Mood and Affect: Mood normal.        Behavior: Behavior normal.        Thought Content: Thought content normal.        Judgment: Judgment normal.      Assessment and Plan   1. Pharyngitis, unspecified etiology - POCT rapid strep A -  amoxicillin (AMOXIL) 500 MG capsule; Take 1 capsule (500 mg total) by mouth 3 (three) times daily.  Dispense: 30 capsule; Refill: 0   Pt siting in car, temp was elevated on exam. Pt given amoxicillin based on centor criteria. Tylenol/ibuprofen for pain.   Call if not improving in next 3-5 days.  Pt in agreement.  F/u prn.

## 2020-01-13 ENCOUNTER — Other Ambulatory Visit: Payer: Self-pay | Admitting: Family Medicine

## 2020-01-13 MED ORDER — METHYLPHENIDATE HCL ER (OSM) 54 MG PO TBCR: EXTENDED_RELEASE_TABLET | ORAL | 0 refills | Status: DC

## 2020-01-13 NOTE — Telephone Encounter (Signed)
Pt contacted and verbalized understanding.  

## 2020-01-13 NOTE — Telephone Encounter (Signed)
Henry Hughes has appt for September 7th for ADHD follow up but needs refill until the appt methylphenidate 54 MG PO CR tablet   Pt call back [phone]

## 2020-01-13 NOTE — Telephone Encounter (Signed)
Ok pls set up the number of tab till then. Then I can approve. Thx. Dr. Darene Lamer

## 2020-01-13 NOTE — Telephone Encounter (Signed)
Please advise. Thank you

## 2020-02-04 ENCOUNTER — Ambulatory Visit (HOSPITAL_COMMUNITY)
Admission: EM | Admit: 2020-02-04 | Discharge: 2020-02-04 | Disposition: A | Discharge status: Home / Self Care | Payer: 59

## 2020-02-04 ENCOUNTER — Other Ambulatory Visit: Payer: Self-pay

## 2020-02-11 ENCOUNTER — Ambulatory Visit: Payer: 59 | Admitting: Family Medicine

## 2020-02-20 ENCOUNTER — Other Ambulatory Visit: Payer: Self-pay

## 2020-02-20 ENCOUNTER — Ambulatory Visit (INDEPENDENT_AMBULATORY_CARE_PROVIDER_SITE_OTHER): Payer: 59 | Admitting: Family Medicine

## 2020-02-20 ENCOUNTER — Encounter: Payer: Self-pay | Admitting: Family Medicine

## 2020-02-20 VITALS — BP 128/82 | HR 78 | Temp 98.1°F | Ht 67.0 in | Wt 191.4 lb

## 2020-02-20 DIAGNOSIS — R937 Abnormal findings on diagnostic imaging of other parts of musculoskeletal system: Secondary | ICD-10-CM | POA: Diagnosis not present

## 2020-02-20 DIAGNOSIS — F900 Attention-deficit hyperactivity disorder, predominantly inattentive type: Secondary | ICD-10-CM | POA: Diagnosis not present

## 2020-02-20 DIAGNOSIS — F32A Depression, unspecified: Secondary | ICD-10-CM

## 2020-02-20 DIAGNOSIS — F329 Major depressive disorder, single episode, unspecified: Secondary | ICD-10-CM

## 2020-02-20 DIAGNOSIS — R930 Abnormal findings on diagnostic imaging of skull and head, not elsewhere classified: Secondary | ICD-10-CM

## 2020-02-20 DIAGNOSIS — F419 Anxiety disorder, unspecified: Secondary | ICD-10-CM | POA: Diagnosis not present

## 2020-02-20 MED ORDER — METHYLPHENIDATE HCL ER (OSM) 36 MG PO TBCR: 36.0000 mg | EXTENDED_RELEASE_TABLET | Freq: Every day | ORAL | 0 refills | Status: DC

## 2020-02-20 MED ORDER — METHYLPHENIDATE HCL ER (OSM) 36 MG PO TBCR: 36.0000 mg | EXTENDED_RELEASE_TABLET | ORAL | 0 refills | Status: DC

## 2020-02-20 MED ORDER — BUSPIRONE HCL 5 MG PO TABS: 5.0000 mg | ORAL_TABLET | Freq: Three times a day (TID) | ORAL | 0 refills | Status: DC | PRN

## 2020-02-20 NOTE — Progress Notes (Signed)
Patient ID: Henry Hughes, male    DOB: 1995-08-20, 24 y.o.   MRN: [id number]   Chief Complaint  Patient presents with  . ADHD    follow up- Patient would like to lower dose of  Concerta or discuss other options. Patient states the medication is causing anxiety and affecting sexual performance.   Subjective:    HPI  F/u ADHD and anxiety/depression.  Been on concerta for 2.5 yrs.  Dx with 2.5 yrs ago.   Pt requesting to decrease his concerta from 54mg  to 36mg . On 54mg  cr concerta currently.  Having increased anxiety and affecting sexual.  Stopped taking lexapro, was on it for about 3 months.  Went from 10mg  to 20mg .  Then the sexual dysfunction with lexapro.    Sleep- taking 4 benadryl per night to sleep. Eating- dec.   Doing college online  50-60 hrs of working and school. Feels the [place] working for day issues.   Started the lexapro in 4/21. Relationship issues due to depression. Pt requesting to speak with counselor for his depression/anxiety.  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  Saw dentist for cleaning yesterday- Pt told by dentist yesterday, had abnormal xray, but "not to worry about it." May need to repeat - facial xray. Due to abnormal xray at dentist.  [place] has no past medical history on file.   Outpatient Encounter Medications as of 02/20/2020  Medication Sig  . busPIRone (BUSPAR) 5 MG tablet Take 1 tablet (5 mg total) by mouth 3 (three) times daily as needed.  . methylphenidate (CONCERTA) 36 MG PO CR tablet Take 1 tablet (36 mg total) by mouth daily.  . methylphenidate (CONCERTA) 36 MG PO CR tablet Take 1 tablet (36 mg total) by mouth daily.  . methylphenidate (CONCERTA) 36 MG PO CR tablet Take 1 tablet (36 mg total) by mouth every morning.  . [DISCONTINUED] amoxicillin (AMOXIL) 500 MG capsule Take 1 capsule (500 mg total) by mouth 3 (three) times daily.  . [DISCONTINUED] escitalopram (LEXAPRO) 20 MG tablet Take one qam  . [DISCONTINUED]  methylphenidate 54 MG PO CR tablet Take one tablet po  daily  . [DISCONTINUED] methylphenidate 54 MG PO CR tablet Take one tablet po daily  . [DISCONTINUED] methylphenidate 54 MG PO CR tablet Take one tablet po daily   No facility-administered encounter medications on file as of 02/20/2020.     Review of Systems  Constitutional: Negative for chills and fever.  HENT: Negative for congestion, rhinorrhea and sore throat.   Respiratory: Negative for cough, shortness of breath and wheezing.   Cardiovascular: Negative for chest pain and leg swelling.  Gastrointestinal: Negative for abdominal pain, diarrhea, nausea and vomiting.  Genitourinary: Negative for dysuria and frequency.  Skin: Negative for rash.  Neurological: Negative for dizziness, weakness and headaches.  Psychiatric/Behavioral: Positive for dysphoric mood and sleep disturbance. Negative for agitation, behavioral problems, confusion, decreased concentration, hallucinations, self-injury and suicidal ideas. The patient is nervous/anxious. The patient is not hyperactive.      Vitals BP 128/82   Pulse 78   Temp 98.1 F (36.7 C) (Oral)   Ht 5\' 7"  (1.702 m)   Wt 191 lb 6.4 oz (86.8 kg)   SpO2 100%   BMI 29.98 kg/m   Objective:   Physical Exam Vitals and nursing note reviewed.  Constitutional:      General: He is not in acute distress.    Appearance: Normal appearance. He is not ill-appearing.  HENT:     Head: Normocephalic.  Nose: Nose normal. No congestion.     Mouth/Throat:     Mouth: Mucous membranes are moist.     Pharynx: No oropharyngeal exudate.  Eyes:     Extraocular Movements: Extraocular movements intact.     Conjunctiva/sclera: Conjunctivae normal.     Pupils: Pupils are equal, round, and reactive to light.  Cardiovascular:     Rate and Rhythm: Normal rate and regular rhythm.     Pulses: Normal pulses.     Heart sounds: Normal heart sounds. No murmur heard.   Pulmonary:     Effort: Pulmonary effort  is normal.     Breath sounds: Normal breath sounds. No wheezing, rhonchi or rales.  Musculoskeletal:        General: Normal range of motion.     Right lower leg: No edema.     Left lower leg: No edema.  Skin:    General: Skin is warm and dry.     Findings: No rash.  Neurological:     General: No focal deficit present.     Mental Status: He is alert and oriented to person, place, and time.     Cranial Nerves: No cranial nerve deficit.  Psychiatric:        Behavior: Behavior normal.        Thought Content: Thought content normal.        Judgment: Judgment normal.     Comments: +anxious mood/affect.      Assessment and Plan   1. Attention deficit hyperactivity disorder (ADHD), predominantly inattentive type - methylphenidate (CONCERTA) 36 MG PO CR tablet; Take 1 tablet (36 mg total) by mouth daily.  Dispense: 30 tablet; Refill: 0 - methylphenidate (CONCERTA) 36 MG PO CR tablet; Take 1 tablet (36 mg total) by mouth daily.  Dispense: 30 tablet; Refill: 0 - methylphenidate (CONCERTA) 36 MG PO CR tablet; Take 1 tablet (36 mg total) by mouth every morning.  Dispense: 30 tablet; Refill: 0  2. Depression, unspecified depression type - Ambulatory referral to Psychology  3. Anxiety - Ambulatory referral to Psychology  4. Abnormal x-ray of facial bones   adhd- dec concerta from 54 mg to 36mg .  Anxiety- pt given trial of buspar prn.  Referral to psychologist. Depression- worsening.  Referral given to psychologist.  Pt declining trying something for this at this time.  Xray-facial bone abnormality- Pt told by dentist yesterday, had abnormal xray, but "not to worry about it." May need to repeat - facial xray. Due to abnormal xray at dentist. Pt to call dentist about the reading of the xray and to call us back so we can order further follow up study if needed. No pain with this on the jaw/bone.   F/u 48mo or prn.

## 2020-02-27 ENCOUNTER — Encounter: Payer: Self-pay | Admitting: Family Medicine

## 2020-03-06 ENCOUNTER — Telehealth: Payer: Self-pay

## 2020-03-06 MED ORDER — BUSPIRONE HCL 10 MG PO TABS
10.0000 mg | ORAL_TABLET | Freq: Two times a day (BID) | ORAL | 1 refills | Status: DC | PRN
Start: 2020-03-06 — End: 2020-03-24

## 2020-03-06 NOTE — Telephone Encounter (Signed)
Pt is requesting refill on busPIRone (BUSPAR) 5 MG tablet  [facility] [place], [place] - [address]  He is wanting to know if he can increase dosage to 15 MG.

## 2020-03-06 NOTE — Telephone Encounter (Signed)
I would recommend going to 10mg  bid and see how he does with this.   Thx. Dr. Lovena Le

## 2020-03-06 NOTE — Telephone Encounter (Signed)
Patient notified

## 2020-03-23 ENCOUNTER — Telehealth: Payer: Self-pay

## 2020-03-23 NOTE — Telephone Encounter (Signed)
Patient is requesting refill on Buspar 10 mg and wanting to up the dosage. [facility]

## 2020-03-23 NOTE — Telephone Encounter (Signed)
Needs video visit for uncontrolled anxiety. Thx. Dr. Lovena Le

## 2020-03-23 NOTE — Telephone Encounter (Signed)
Pt notified and sent to the front to schedule.

## 2020-03-24 ENCOUNTER — Telehealth (INDEPENDENT_AMBULATORY_CARE_PROVIDER_SITE_OTHER): Payer: 59 | Admitting: Family Medicine

## 2020-03-24 ENCOUNTER — Telehealth: Payer: Self-pay | Admitting: Family Medicine

## 2020-03-24 ENCOUNTER — Encounter: Payer: Self-pay | Admitting: Family Medicine

## 2020-03-24 ENCOUNTER — Other Ambulatory Visit: Payer: Self-pay

## 2020-03-24 DIAGNOSIS — F419 Anxiety disorder, unspecified: Secondary | ICD-10-CM

## 2020-03-24 DIAGNOSIS — F909 Attention-deficit hyperactivity disorder, unspecified type: Secondary | ICD-10-CM | POA: Diagnosis not present

## 2020-03-24 MED ORDER — BUSPIRONE HCL 30 MG PO TABS: ORAL_TABLET | ORAL | 0 refills | Status: DC

## 2020-03-24 NOTE — Addendum Note (Signed)
Addended by: Erven Colla on: 03/24/2020 06:49 PM   Modules accepted: Orders

## 2020-03-24 NOTE — Progress Notes (Signed)
Virtual Visit via Telephone Note  I connected with Henry Hughes on 03/24/20 at  3:30 PM EDT by telephone and verified that I am speaking with the correct person using two identifiers.  Location: Patient: home Provider: office   I discussed the limitations, risks, security and privacy concerns of performing an evaluation and management service by telephone and the availability of in person appointments. I also discussed with the patient that there may be a patient responsible charge related to this service. The patient expressed understanding and agreed to proceed.    Patient ID: Henry Hughes, male    DOB: 12/17/1995, 24 y.o.   MRN: [id number]   Chief Complaint  Patient presents with  . Anxiety   Subjective:    HPI  Per nurse notes- "Pt needing to discuss anxiety. Pt is on Buspar. Has been working pretty good. Pt has been taking 2 10 mg tablets daily. Pt prefers to try 20 mg or 30 mg. Pt has done research on Buspar. Pt is also on Concerta. Pt states he just wants to take one tablet daily. Pt has tried antidepressants before and would prefer to not take those."  Pt calling to f/u on the anxiety medications. Feeling the buspar is working well, but needing to increase the dose. Not wanting to use ssri, had used it in past.  Was on 5mg  tid then we increased to 10mg  bid. Feeling by lunch needing it again.  Pt has dec the concerta dec from 54mg  down tot 36mg . Doesn't want to have to take multiple times. Pt wanting to go up to the 30mg  dosage.  Feeling that the medication is wearing off by lunch at the lower dose.   Medical History Henry Hughes has no past medical history on file.   Outpatient Encounter Medications as of 03/24/2020  Medication Sig  . methylphenidate (CONCERTA) 36 MG PO CR tablet Take 1 tablet (36 mg total) by mouth daily.  . methylphenidate (CONCERTA) 36 MG PO CR tablet Take 1 tablet (36 mg total) by mouth daily.  . methylphenidate (CONCERTA) 36 MG PO CR tablet  Take 1 tablet (36 mg total) by mouth every morning.  . [DISCONTINUED] busPIRone (BUSPAR) 10 MG tablet Take 1 tablet (10 mg total) by mouth 2 (two) times daily as needed.  . busPIRone (BUSPAR) 30 MG tablet Take 1/2-1 tab p.o. bid.   No facility-administered encounter medications on file as of 03/24/2020.     Review of Systems  Constitutional: Negative for chills and fever.  Respiratory: Negative for cough, shortness of breath and wheezing.   Cardiovascular: Negative for chest pain and leg swelling.  Neurological: Negative for dizziness, weakness and headaches.  Psychiatric/Behavioral: Negative for agitation, decreased concentration, dysphoric mood, hallucinations, self-injury, sleep disturbance and suicidal ideas. The patient is nervous/anxious. The patient is not hyperactive.      Vitals There were no vitals taken for this visit.  Objective:   Physical Exam  Phone visit, no PE  Assessment and Plan   1. Anxiety - busPIRone (BUSPAR) 30 MG tablet; Take 1/2-1 tab p.o. bid.  Dispense: 60 tablet; Refill: 0    Reviewed to call if having any concerns of headache, dizziness, or abnormal movements.  F/u 4 wks for phone visit to discuss how the medications are going.  Pt in agreement.    Follow Up Instructions:    I discussed the assessment and treatment plan with the patient. The patient was provided an opportunity to ask questions and all were answered. The patient  agreed with the plan and demonstrated an understanding of the instructions.   The patient was advised to call back or seek an in-person evaluation if the symptoms worsen or if the condition fails to improve as anticipated.  I provided 15 minutes of non-face-to-face time during this encounter.

## 2020-03-24 NOTE — Telephone Encounter (Signed)
Mr. tarrin, lebow are scheduled for a virtual visit with your provider today.    Just as we do with appointments in the office, we must obtain your consent to participate.  Your consent will be active for this visit and any virtual visit you may have with one of our providers in the next 365 days.    If you have a MyChart account, I can also send a copy of this consent to you electronically.  All virtual visits are billed to your insurance company just like a traditional visit in the office.  As this is a virtual visit, video technology does not allow for your provider to perform a traditional examination.  This may limit your provider's ability to fully assess your condition.  If your provider identifies any concerns that need to be evaluated in person or the need to arrange testing such as labs, EKG, etc, we will make arrangements to do so.    Although advances in technology are sophisticated, we cannot ensure that it will always work on either your end or our end.  If the connection with a video visit is poor, we may have to switch to a telephone visit.  With either a video or telephone visit, we are not always able to ensure that we have a secure connection.   I need to obtain your verbal consent now.   Are you willing to proceed with your visit today?   BRITTAN MAPEL has provided verbal consent on 03/24/2020 for a virtual visit (video or telephone).   Vicente Males, LPN [id number]/[id number]  48:27 AM

## 2020-03-25 NOTE — Telephone Encounter (Signed)
Pt contacted and states that he was not informed of side effects. Pt states that he just picked up 30 mg tablets from pharmacy. Pt would prefer to keep 30 mg and break them in half (if possible). Pt states he has already taken a 30 mg this morning (is feeling fine). Pt is wanting to know how much he can take without having permeant side effects. Please advise. Thank you

## 2020-03-25 NOTE — Telephone Encounter (Signed)
Well I called and cancelled the 30mg  tablets from [facility].  So not sure why they still dispensed it.   I would just take 15mg  in AM and in PM for a week and see how he's feeling.   If feeling okay then I would stay at this dose.  If still feeling anxiety not controlled then go to 15mg  3x per day.   This is the max dose I would recommend. No way to know if anyone will have a side effect till they try the medication, but I'm not recommending to start at 30mg  at this time.  I would titrate up slowly and see if having any side effects.    Thanks,   Dr. Lovena Le

## 2020-03-25 NOTE — Telephone Encounter (Signed)
Pt contacted. Pt states that is he unable to cut Buspar in half. Pt was wanting to know if he could take 20 mg in the AM and PM. Consulted with provider. Provider states that would be ok. Informed pt that 20 mg in the morning and 20 mg in the evening is fine. Informed pt that if he begins to feel difference in any type of way, to give [place] a call. Pt verbalized understanding.

## 2020-03-31 ENCOUNTER — Other Ambulatory Visit: Payer: Self-pay

## 2020-03-31 ENCOUNTER — Encounter: Payer: Self-pay | Admitting: Family Medicine

## 2020-03-31 ENCOUNTER — Ambulatory Visit (INDEPENDENT_AMBULATORY_CARE_PROVIDER_SITE_OTHER): Payer: 59 | Admitting: Family Medicine

## 2020-03-31 VITALS — BP 126/78 | HR 85 | Temp 95.4°F | Wt 201.0 lb

## 2020-03-31 DIAGNOSIS — D239 Other benign neoplasm of skin, unspecified: Secondary | ICD-10-CM | POA: Diagnosis not present

## 2020-03-31 DIAGNOSIS — Z113 Encounter for screening for infections with a predominantly sexual mode of transmission: Secondary | ICD-10-CM

## 2020-03-31 NOTE — Patient Instructions (Signed)
Genital Warts Genital warts are small growths in the area around the genitals or the anus. They are caused by a type of germ (HPV virus). This germ is spread from person to person during sex. It can be spread through vaginal, anal, and oral sex. Genital warts can lead to other problems if they are not treated. A person is more likely to have this condition if he or she:  Has sex without using a condom.  Has sex with many people.  Has sex before the age of 1.  Has a weak body defense (immune) system. This condition can be treated with medicines. Your doctor may also burn or freeze the warts. In some cases, surgery may be done to remove the warts. Follow these instructions at home: Medicines   Apply over-the-counter and prescription medicines only as told by your doctor.  Do not use medicines that are meant for treating hand warts.  Talk with your doctor about using creams to treat itching. Instructions for women  Plan to have regular tests to check for cervical cancer. Your risk for this cancer increases when you have genital warts.  If you become pregnant, tell your doctor that you have had genital warts. The germ can be passed to the baby. General instructions  Do not touch or scratch the warts.  Do not have sex until your treatment is done.  Tell your current and past sexual partners about your condition. They may need treatment.  After treatment, use condoms during sex.  Keep all follow-up visits as told by your doctor. This is important. How is this prevented? Talk with your doctor about getting the HPV shot. The HPV shot:  Can help stop some HPV infections and cancers.  Is given to males and females who are 45-17 years old.  Will not work if you already have HPV.  Is not recommended for pregnant women. Contact a doctor if:  You have redness, swelling, or pain in the area of the treated skin.  You have a fever.  You feel sick.  You feel lumps in the area  around your genitals or anus.  You have bleeding in the area around your genitals or anus.  You have pain during sex. Summary  Genital warts are small growths in the areas around the genitals or the anus. They are caused by a type of germ (HPV virus).  The germ is spread by having vaginal, anal, or oral sex without using a condom.  This condition is treated using medicines. In some cases, freezing, burning, or surgery may be done to get rid of the warts.  This condition may be prevented by getting a HPV shot. This information is not intended to replace advice given to you by your health care provider. Make sure you discuss any questions you have with your health care provider. Document Revised: 06/27/2017 Document Reviewed: 06/27/2017 Elsevier Patient Education  2020 [employer].

## 2020-03-31 NOTE — Progress Notes (Signed)
Pt here for full panel STD screen. Pt has sexual contact with person on Sept 20. Has bumps on pubic area. Painless, no discharge.    Patient ID: Henry Hughes, male    DOB: 23-Feb-1996, 24 y.o.   MRN: [id number]   Chief Complaint  Patient presents with  . STD Screen   Subjective:  CC: concern for STI   Presents today for sexually transmitted infection concerns.  On September 20, had unprotected sex with a new partner.  On October 3, he shaved in the area around his penis, and noted that he had some bumps.  2 areas were present.  Denies history of sexually transmitted infections.  Only has sex with male partners.  Did not use a condom.  Has been treating the area with medication that he got over-the-counter for warts.  Denies any ulcers on his genitals/penis, denies any penile drainage.  Reports that the sexual partner was tested for gonorrhea and chlamydia and was negative, and has received the HPV vaccine.    Medical History [place] has no past medical history on file.   Outpatient Encounter Medications as of 03/31/2020  Medication Sig  . methylphenidate (CONCERTA) 36 MG PO CR tablet Take 1 tablet (36 mg total) by mouth daily.  . methylphenidate (CONCERTA) 36 MG PO CR tablet Take 1 tablet (36 mg total) by mouth daily.  . methylphenidate (CONCERTA) 36 MG PO CR tablet Take 1 tablet (36 mg total) by mouth every morning.   No facility-administered encounter medications on file as of 03/31/2020.   No results found for any visits on 03/31/20.   Review of Systems  Constitutional: Negative.   HENT: Negative.   Eyes: Negative.   Respiratory: Negative.   Cardiovascular: Negative.   Gastrointestinal: Negative.   Endocrine: Negative.   Genitourinary: Negative for discharge, dysuria, genital sores, penile pain, penile swelling, scrotal swelling and testicular pain.  Musculoskeletal: Negative.   Skin: Positive for rash.       2 raised areas that appeared to look like a wart.    Neurological: Negative.   Hematological: Negative.   Psychiatric/Behavioral: Negative.        Very concerned about the situation.     Vitals BP 126/78   Pulse 85   Temp (!) 95.4 F (35.2 C)   Wt 201 lb (91.2 kg)   SpO2 99%   BMI 31.48 kg/m   Objective:   Physical Exam Vitals and nursing note reviewed. Exam conducted with a chaperone present.  Constitutional:      General: He is not in acute distress.    Appearance: Normal appearance.  Pulmonary:     Effort: Pulmonary effort is normal.  Genitourinary:    Comments: Area noted around the base of the penis in the suprapubic area, 2 areas that have been treated with over-the-counter wart medication, and are white in appearance.  He had a picture of what the area look like prior to treatment, and it does look like it could be a wart.  There are no ulcerations on the penis.  Difficult to know for sure, since these areas have been treated and no longer look the same.  It is possible that these bumps could be related to shaving. Skin:    General: Skin is warm and dry.  Neurological:     Mental Status: He is alert and oriented to person, place, and time.  Psychiatric:        Behavior: Behavior normal.     Comments: Extremely concerned  about the situation.      Assessment and Plan   1. Screening examination for STD (sexually transmitted disease) - POCT Urinalysis Dipstick - HIV antibody (with reflex) - RPR - GC/Chlamydia Probe Amp([facility])  2. Papilloma of skin - Ambulatory referral to Dermatology   Detailed discussion concerning testing for sexually transmitted infections.  He will stop on the way out today to have these test drawn.  Urine sent for GC chlamydia.  Encouraged patient to disclose this information to his current sexual partner, he does not seem eager to do that.  Consistent condom use is also encouraged.  Discussed risky behavior, associated with excessive alcohol intake, and sexual behavior.  Dermatology  referral offered to patient, for a more definitive treatment.  He wishes for this treatment to be completed before this coming Saturday, however, it is highly unlikely this will occur.  Agrees with plan of care discussed today. Understands warning signs to seek further care: Fever, spreading of infection, any significant in health status.  We will notify him of results once they become available. Understands to follow-up with dermatology, with [place] if needed.  Again, I highly encouraged him to disclose this information to his current sexual partner.     Chalmers Guest, NP 03/31/2020

## 2020-04-01 LAB — HIV ANTIBODY (ROUTINE TESTING W REFLEX): HIV Screen 4th Generation wRfx: NONREACTIVE

## 2020-04-01 LAB — RPR: RPR Ser Ql: NONREACTIVE

## 2020-04-03 LAB — GC/CHLAMYDIA PROBE AMP
Chlamydia trachomatis, NAA: NEGATIVE
Neisseria Gonorrhoeae by PCR: NEGATIVE

## 2020-04-16 ENCOUNTER — Other Ambulatory Visit: Payer: Self-pay

## 2020-04-16 ENCOUNTER — Other Ambulatory Visit (INDEPENDENT_AMBULATORY_CARE_PROVIDER_SITE_OTHER): Payer: 59 | Admitting: *Deleted

## 2020-04-16 DIAGNOSIS — Z23 Encounter for immunization: Secondary | ICD-10-CM | POA: Diagnosis not present

## 2020-05-18 ENCOUNTER — Telehealth: Payer: Self-pay | Admitting: *Deleted

## 2020-05-18 ENCOUNTER — Other Ambulatory Visit: Payer: Self-pay

## 2020-05-18 ENCOUNTER — Ambulatory Visit (INDEPENDENT_AMBULATORY_CARE_PROVIDER_SITE_OTHER): Payer: 59 | Admitting: Family Medicine

## 2020-05-18 ENCOUNTER — Other Ambulatory Visit: Payer: Self-pay | Admitting: Family Medicine

## 2020-05-18 ENCOUNTER — Encounter: Payer: Self-pay | Admitting: Family Medicine

## 2020-05-18 VITALS — BP 122/82 | HR 102 | Temp 99.0°F | Ht 67.0 in | Wt 197.2 lb

## 2020-05-18 DIAGNOSIS — F419 Anxiety disorder, unspecified: Secondary | ICD-10-CM | POA: Diagnosis not present

## 2020-05-18 DIAGNOSIS — N489 Disorder of penis, unspecified: Secondary | ICD-10-CM | POA: Diagnosis not present

## 2020-05-18 DIAGNOSIS — F900 Attention-deficit hyperactivity disorder, predominantly inattentive type: Secondary | ICD-10-CM | POA: Diagnosis not present

## 2020-05-18 MED ORDER — METHYLPHENIDATE HCL ER (OSM) 36 MG PO TBCR: 36.0000 mg | EXTENDED_RELEASE_TABLET | Freq: Every day | ORAL | 0 refills | Status: DC

## 2020-05-18 MED ORDER — METHYLPHENIDATE HCL ER (OSM) 36 MG PO TBCR: 36.0000 mg | EXTENDED_RELEASE_TABLET | ORAL | 0 refills | Status: DC

## 2020-05-18 MED ORDER — CLONAZEPAM 0.5 MG PO TABS: 0.5000 mg | ORAL_TABLET | Freq: Two times a day (BID) | ORAL | 0 refills | Status: DC | PRN

## 2020-05-18 MED ORDER — BUSPIRONE HCL 15 MG PO TABS: 15.0000 mg | ORAL_TABLET | Freq: Every day | ORAL | 0 refills | Status: DC

## 2020-05-18 NOTE — Progress Notes (Signed)
Patient ID: Henry Hughes, male    DOB: Oct 21, 1995, 24 y.o.   MRN: [id number]   Chief Complaint  Patient presents with   ADHD   SEXUALLY TRANSMITTED DISEASE    Concern of std/hsv   Subjective:    HPI   Pt here for ADHD follow up. Pt taking Concerta 36 mg. Pt doing well on current dose.   Anxiety- Pt splitting Buspar 30 mg tablet  in half each night. Pt not taking at night due to making him feel slow in the daytime.  Pt requesting labs for testing for hsv 1/2.  Pt is dealing with hpv.  Went to dermatologist. Putting compound W on it.  Seen a few docs and has cryotherapy on the penis and told was hpv.  Has a couple of spots after the treatment recently of the cryotherapy. Red ulcers, on end of penis, no itching.  Not hurting on the penis. All resolved.  hsv1 and hsv 2- negative 2019. Hasn't had sexual contact since sept.   04/17/20 noticed lesion and resolved in about 2 day period. Pt showed picture of lesions on head of penis from his phone. About 5 lesions near glans of head of penis. Pt stating not painful. Not itching or burning. Last time intercourse 02/24/20.  Pt stating he is crying everyday and waking up in AM crying. But getting through the day then having issues at night sleeping.  Taking the buspar 15mg  at night.  Not taking buspar during the day, feeling it's "slowing him down."  Pt doing well with ADHD medications.  No side effects.  Medical History Henry Hughes has no past medical history on file.   Outpatient Encounter Medications as of 05/18/2020  Medication Sig   [DISCONTINUED] busPIRone (BUSPAR) 30 MG tablet Take 30 mg by mouth 2 (two) times daily.   [DISCONTINUED] methylphenidate (CONCERTA) 36 MG PO CR tablet Take 1 tablet (36 mg total) by mouth daily.   [DISCONTINUED] methylphenidate (CONCERTA) 36 MG PO CR tablet Take 1 tablet (36 mg total) by mouth daily.   [DISCONTINUED] methylphenidate (CONCERTA) 36 MG PO CR tablet Take 1 tablet (36 mg  total) by mouth every morning.   busPIRone (BUSPAR) 15 MG tablet Take 1 tablet (15 mg total) by mouth at bedtime.   clonazePAM (KLONOPIN) 0.5 MG tablet Take 1 tablet (0.5 mg total) by mouth 2 (two) times daily as needed for anxiety.   methylphenidate (CONCERTA) 36 MG PO CR tablet Take 1 tablet (36 mg total) by mouth daily.   methylphenidate (CONCERTA) 36 MG PO CR tablet Take 1 tablet (36 mg total) by mouth daily.   methylphenidate (CONCERTA) 36 MG PO CR tablet Take 1 tablet (36 mg total) by mouth every morning.   [DISCONTINUED] busPIRone (BUSPAR) 15 MG tablet Take 1 tablet (15 mg total) by mouth at bedtime.   No facility-administered encounter medications on file as of 05/18/2020.     Review of Systems  Constitutional: Negative for chills and fever.  HENT: Negative for congestion, rhinorrhea and sore throat.   Respiratory: Negative for cough, shortness of breath and wheezing.   Cardiovascular: Negative for chest pain and leg swelling.  Gastrointestinal: Negative for abdominal pain, diarrhea, nausea and vomiting.  Genitourinary: Positive for genital sores. Negative for difficulty urinating, dysuria, frequency, penile discharge, penile pain, penile swelling and testicular pain.  Skin: Positive for rash (penile lesions).  Neurological: Negative for dizziness, weakness and headaches.     Vitals BP 122/82    Pulse [name] Kitchen)  102    Temp 99 F (37.2 C)    Ht 5\' 7"  (1.702 m)    Wt 197 lb 3.2 oz (89.4 kg)    SpO2 99%    BMI 30.89 kg/m   Objective:   Physical Exam Vitals and nursing note reviewed.  Constitutional:      General: He is not in acute distress.    Appearance: Normal appearance. He is not ill-appearing.  HENT:     Head: Normocephalic.     Nose: Nose normal. No congestion.     Mouth/Throat:     Mouth: Mucous membranes are moist.     Pharynx: No oropharyngeal exudate.  Eyes:     Extraocular Movements: Extraocular movements intact.     Conjunctiva/sclera: Conjunctivae  normal.     Pupils: Pupils are equal, round, and reactive to light.  Cardiovascular:     Rate and Rhythm: Normal rate and regular rhythm.     Pulses: Normal pulses.     Heart sounds: Normal heart sounds. No murmur heard.   Pulmonary:     Effort: Pulmonary effort is normal.     Breath sounds: Normal breath sounds. No wheezing, rhonchi or rales.  Genitourinary:    Comments: +pt declined exam today, on photo of the lesions of the penis, erythematous papules on tip of the penis and glans, with erythematous base with yellow central area. No blisters.  Musculoskeletal:        General: Normal range of motion.     Right lower leg: No edema.     Left lower leg: No edema.  Skin:    General: Skin is warm and dry.     Findings: No rash.  Neurological:     General: No focal deficit present.     Mental Status: He is alert and oriented to person, place, and time.     Cranial Nerves: No cranial nerve deficit.  Psychiatric:        Mood and Affect: Mood normal.        Behavior: Behavior normal.        Thought Content: Thought content normal.        Judgment: Judgment normal.     Comments: +tearful, anxious affect      Assessment and Plan   1. Penile lesion - HSV(herpes simplex vrs) 1+2 ab-IgG - HSV(herpes simplex vrs) 1+2 ab-IgM  2. Attention deficit hyperactivity disorder (ADHD), predominantly inattentive type - methylphenidate (CONCERTA) 36 MG PO CR tablet; Take 1 tablet (36 mg total) by mouth daily.  Dispense: 30 tablet; Refill: 0 - methylphenidate (CONCERTA) 36 MG PO CR tablet; Take 1 tablet (36 mg total) by mouth daily.  Dispense: 30 tablet; Refill: 0 - methylphenidate (CONCERTA) 36 MG PO CR tablet; Take 1 tablet (36 mg total) by mouth every morning.  Dispense: 30 tablet; Refill: 0  3. Anxiety - clonazePAM (KLONOPIN) 0.5 MG tablet; Take 1 tablet (0.5 mg total) by mouth 2 (two) times daily as needed for anxiety.  Dispense: 20 tablet; Refill: 0 - busPIRone (BUSPAR) 15 MG tablet; Take  1 tablet (15 mg total) by mouth at bedtime.  Dispense: 30 tablet; Refill: 0   Will cont to keep buspar 15mg  qhs in case he needs it and klonapin not working. Pt to call back if it's not working. Gave pt klonapin to try for anxiety prn.  Pt called back due to wanting to continue to take the buspar at night, and not wanting to be on klonapin daily.  Reviewed with  pt need to wear condoms with partners and tell any recent partners to get tested. Pt voiced understanding.  Pt declining further std testing, had some in 10/21.  Penile lesions- concerning for the appearance of herpes.  Advising to get the HSV blood testing.  Not sure if we will see antibodies if the rash resolved.  And to wear condoms and to disclose to all partners to get testing.  F/u 12mo  Or prn.

## 2020-05-18 NOTE — Telephone Encounter (Signed)
Sent in 1 month supply.  Pt to call back in 2-3 wks and let us know how it's going with the new medications and if needing a refill.   Dr. Lovena Le

## 2020-05-18 NOTE — Telephone Encounter (Signed)
Patient notified

## 2020-05-18 NOTE — Telephone Encounter (Signed)
Spoke to pt and let him know the plan was to take the Klonopin and see how that works for the next week. Patient states he was doing ok on the Buspar and doesn't want to stop it completely. Patient does not have any more Buspar. Patient does not want to be on the Klonopin all the time - he states his anxiety was increased today because of a mix up with his lab work. He was doing fine with the Buspar and would prefer to stay on that over the klonopin.

## 2020-05-20 LAB — HSV(HERPES SIMPLEX VRS) I + II AB-IGG
HSV 1 Glycoprotein G Ab, IgG: 1.72 index — ABNORMAL HIGH (ref 0.00–0.90)
HSV 2 IgG, Type Spec: <0.91 index (ref 0.00–0.90)

## 2020-05-20 LAB — HSV(HERPES SIMPLEX VRS) I + II AB-IGM: HSVI/II Comb IgM: <0.91 Ratio (ref 0.00–0.90)

## 2020-05-21 ENCOUNTER — Telehealth: Payer: Self-pay

## 2020-05-21 NOTE — Telephone Encounter (Signed)
Discussed with pt. Pt still has a lot of questions and concerns and I sent pt to the front to schedule a phone visit with dr to discuss

## 2020-05-21 NOTE — Telephone Encounter (Signed)
Patient wanting to know what type of antibiots in had in his system. He had labs done on 12/13. Please advise

## 2020-05-25 ENCOUNTER — Telehealth: Payer: Self-pay | Admitting: Family Medicine

## 2020-05-25 NOTE — Telephone Encounter (Signed)
Patient is requesting refill a higher dosage of clonazepam 0.5 mg for anxiety stating this low dosage not helping.He had appointment on 12/13 and has follow up on 12/22. Please advise [facility] [place]

## 2020-05-25 NOTE — Telephone Encounter (Signed)
Would recommend taking the buspar 15mg  bid.  Or recommending a SSRI, like zoloft or celexa for anxiety.   Not wanting to go up on the clonazepam at this time. If he's wanting referral to psychiatry pls give referral.   Dr. Lovena Le

## 2020-05-27 ENCOUNTER — Other Ambulatory Visit: Payer: Self-pay

## 2020-05-27 ENCOUNTER — Encounter: Payer: Self-pay | Admitting: Family Medicine

## 2020-05-27 ENCOUNTER — Telehealth (INDEPENDENT_AMBULATORY_CARE_PROVIDER_SITE_OTHER): Payer: 59 | Admitting: Family Medicine

## 2020-05-27 DIAGNOSIS — A6000 Herpesviral infection of urogenital system, unspecified: Secondary | ICD-10-CM | POA: Diagnosis not present

## 2020-05-27 DIAGNOSIS — F419 Anxiety disorder, unspecified: Secondary | ICD-10-CM | POA: Diagnosis not present

## 2020-05-27 MED ORDER — CLONAZEPAM 0.5 MG PO TABS: 0.5000 mg | ORAL_TABLET | Freq: Two times a day (BID) | ORAL | 0 refills | Status: DC | PRN

## 2020-05-27 NOTE — Progress Notes (Signed)
Patient ID: Henry Hughes, male    DOB: Jun 13, 1995, 24 y.o.   MRN: [id number]   Virtual Visit via Telephone Note  I connected with [name] on 05/27/20 at 10:20 AM EST by telephone and verified that I am speaking with the correct person using two identifiers.  Location: Patient: home Provider: office   I discussed the limitations, risks, security and privacy concerns of performing an evaluation and management service by telephone and the availability of in person appointments. I also discussed with the patient that there may be a patient responsible charge related to this service. The patient expressed understanding and agreed to proceed.  Chief Complaint  Patient presents with  . Anxiety   Subjective:    HPI  pt wants to discuss klonopin/anxiety. Pt called this week and wanting to increase the klonapin for his anxiety through a phone call to nurse this week. Per nurse- she read him last note from Dr Lovena Le stating "she did not want to increase and could do referral." Pt then states he received that message and I told him med was sent in on 12/13 for #20 and pt states he will run out soon because he takes 2 every day. Pt has not run out of medication.  Pt stating buspar not helping at night.  Getting more relief when taking klonapin.  Pt has some anxiety, that is long standing and that last 4 months has worsened that he's been dealing with and not sure what he has "std" either hpv or herpes, he has had increased anxiety.  Pt saw dermatology and told had HPV.  Then a month later had a few lesions on penis and wondered if he had HSV. We ordered labs and was positive for HSV1 antibodies.  Pt having difficult time over last few weeks accepting that he has hsv1 antibodies. And likely has herpes in the genital area.     Medical History Chad has no past medical history on file.   Outpatient Encounter Medications as of 05/27/2020  Medication Sig  . methylphenidate  (CONCERTA) 36 MG PO CR tablet Take 1 tablet (36 mg total) by mouth daily.  . methylphenidate (CONCERTA) 36 MG PO CR tablet Take 1 tablet (36 mg total) by mouth daily.  . methylphenidate (CONCERTA) 36 MG PO CR tablet Take 1 tablet (36 mg total) by mouth every morning.  . [DISCONTINUED] busPIRone (BUSPAR) 15 MG tablet Take 1 tablet (15 mg total) by mouth at bedtime.  . [DISCONTINUED] clonazePAM (KLONOPIN) 0.5 MG tablet Take 1 tablet (0.5 mg total) by mouth 2 (two) times daily as needed for anxiety.  . clonazePAM (KLONOPIN) 0.5 MG tablet Take 1 tablet (0.5 mg total) by mouth 2 (two) times daily as needed for anxiety.   No facility-administered encounter medications on file as of 05/27/2020.     Review of Systems  Constitutional: Negative for chills and fever.  HENT: Negative for congestion, rhinorrhea and sore throat.   Respiratory: Negative for cough, shortness of breath and wheezing.   Cardiovascular: Negative for chest pain and leg swelling.  Gastrointestinal: Negative for abdominal pain, diarrhea, nausea and vomiting.  Genitourinary: Negative for dysuria and frequency.  Skin: Negative for rash.  Neurological: Negative for dizziness, weakness and headaches.  Psychiatric/Behavioral: Negative for dysphoric mood, self-injury, sleep disturbance and suicidal ideas. The patient is nervous/anxious.      Vitals There were no vitals taken for this visit.  Objective:   Physical Exam  No PE due to phone visit.  Assessment and Plan   1. Anxiety - clonazePAM (KLONOPIN) 0.5 MG tablet; Take 1 tablet (0.5 mg total) by mouth 2 (two) times daily as needed for anxiety.  Dispense: 60 tablet; Refill: 0 - Ambulatory referral to Psychiatry  2. Genital herpes simplex, unspecified site   Advised would refill the klonapin, for now but pt needing to see psychiatry.  And discontinue the buspar, since pt stating it's not helping much at night.  Pt refusing SSRI or other medication for  depression/anxiety. Reviewed pt that needs to see psychiatry about anxiety and further refills on Klonapin. Also reviewed pt needing to see counseling also for his anxiety and new diagnosis.  Reviewed with pt the lab results stating he has HSV1 ab and likely related to the rash he saw on his penis, if he hasn't ever had a sore on his mouth.  But advised pt to return when having another outbreak in genital area to get it swabbed and re-tested. Pt voiced understanding.  Pt in agreement.  F/u prn.    Follow Up Instructions:    I discussed the assessment and treatment plan with the patient. The patient was provided an opportunity to ask questions and all were answered. The patient agreed with the plan and demonstrated an understanding of the instructions.   The patient was advised to call back or seek an in-person evaluation if the symptoms worsen or if the condition fails to improve as anticipated.  I provided 15 minutes of non-face-to-face time during this encounter.

## 2020-06-02 ENCOUNTER — Telehealth: Payer: Self-pay | Admitting: Family Medicine

## 2020-06-16 ENCOUNTER — Other Ambulatory Visit: Payer: 59

## 2020-06-17 ENCOUNTER — Other Ambulatory Visit: Payer: Self-pay | Admitting: *Deleted

## 2020-06-17 ENCOUNTER — Telehealth: Payer: Self-pay

## 2020-06-17 DIAGNOSIS — F419 Anxiety disorder, unspecified: Secondary | ICD-10-CM

## 2020-06-17 NOTE — Telephone Encounter (Signed)
Referral ordered 05/27/20- referral coordinator is trying to contact patient - message sent to referral coordinator who states she will contact patient.

## 2020-06-17 NOTE — Telephone Encounter (Signed)
Pt called back checking on referral to Ambulatory referral to Psychiatry he called [facility] they are only taking children [facility] does but was told not taking new pt [facility] give him a number to [facility] needs to Psychiatry pt needs medication for Klonopin .05 mg  Looked on notes did not see referral to [facility]   Pt call back [phone]

## 2020-06-23 ENCOUNTER — Other Ambulatory Visit: Payer: Self-pay | Admitting: *Deleted

## 2020-06-23 ENCOUNTER — Telehealth: Payer: Self-pay | Admitting: *Deleted

## 2020-06-23 ENCOUNTER — Telehealth: Payer: Self-pay

## 2020-06-23 ENCOUNTER — Other Ambulatory Visit: Payer: Self-pay

## 2020-06-23 ENCOUNTER — Other Ambulatory Visit (INDEPENDENT_AMBULATORY_CARE_PROVIDER_SITE_OTHER): Payer: Self-pay | Admitting: *Deleted

## 2020-06-23 DIAGNOSIS — F419 Anxiety disorder, unspecified: Secondary | ICD-10-CM

## 2020-06-23 DIAGNOSIS — Z23 Encounter for immunization: Secondary | ICD-10-CM

## 2020-06-23 NOTE — Telephone Encounter (Signed)
Pt came in for nurse visit for hpv vaccine and asked about psych referral. I looked in referral notes and it had several offices was not taking new pt. Pt states he will go anywhere that can manage his meds. I put in a new referral with a note that he will go anywhere that is taking new pts that will manage his meds. He also states he will be out of klonopin on jan 22nd. State he takes 2 a day.  [facility] [place]

## 2020-06-23 NOTE — Telephone Encounter (Signed)
Loma Sousa spoke with Pt and he said that it will be 3 to 4 weeks before he can get into psychiatry and he is out of his clonazePAM (KLONOPIN) 0.5 MG tablet  Wanting to get this filled one more time until he can get in to see the Dr. Durene Cal to [place], [place] - [address]   Pt call back [phone]

## 2020-06-23 NOTE — Telephone Encounter (Signed)
Message already sent to provider- see 06/23/20 message

## 2020-06-24 NOTE — Telephone Encounter (Signed)
Patient notified and will call us back tomorrow with appointment

## 2020-06-24 NOTE — Telephone Encounter (Signed)
error 

## 2020-06-24 NOTE — Telephone Encounter (Signed)
Give [facility].  They do telemedicine visit and in person.  [phone]  After has an appt then call with the date.  Thx.   Thx.   Dr. Lovena Le

## 2020-09-10 ENCOUNTER — Telehealth: Payer: Self-pay | Admitting: Family Medicine

## 2020-09-10 DIAGNOSIS — F900 Attention-deficit hyperactivity disorder, predominantly inattentive type: Secondary | ICD-10-CM

## 2020-09-10 NOTE — Telephone Encounter (Signed)
05/18/20 last ADD check up and has up coming appt on 4/11

## 2020-09-10 NOTE — Telephone Encounter (Signed)
scheduled appointment of 4/11

## 2020-09-10 NOTE — Telephone Encounter (Signed)
Patient is requesting refill on concerta 30 mg last filled 05/26/20.[facility] [place]  Please advise

## 2020-09-11 MED ORDER — METHYLPHENIDATE HCL ER (OSM) 36 MG PO TBCR: 36.0000 mg | EXTENDED_RELEASE_TABLET | Freq: Every day | ORAL | 0 refills | Status: DC

## 2020-09-11 NOTE — Addendum Note (Signed)
Addended by: Erven Colla on: 09/11/2020 10:53 AM   Modules accepted: Orders

## 2020-09-14 ENCOUNTER — Ambulatory Visit (INDEPENDENT_AMBULATORY_CARE_PROVIDER_SITE_OTHER): Payer: 59 | Admitting: Family Medicine

## 2020-09-14 ENCOUNTER — Other Ambulatory Visit: Payer: Self-pay

## 2020-09-14 ENCOUNTER — Encounter: Payer: Self-pay | Admitting: Family Medicine

## 2020-09-14 ENCOUNTER — Telehealth: Payer: Self-pay

## 2020-09-14 DIAGNOSIS — F900 Attention-deficit hyperactivity disorder, predominantly inattentive type: Secondary | ICD-10-CM

## 2020-09-14 MED ORDER — METHYLPHENIDATE HCL ER (OSM) 36 MG PO TBCR: 36.0000 mg | EXTENDED_RELEASE_TABLET | ORAL | 0 refills | Status: DC

## 2020-09-14 MED ORDER — CONCERTA 36 MG PO TBCR: 36.0000 mg | EXTENDED_RELEASE_TABLET | Freq: Every day | ORAL | 0 refills | Status: DC

## 2020-09-14 MED ORDER — METHYLPHENIDATE HCL ER (OSM) 36 MG PO TBCR: 36.0000 mg | EXTENDED_RELEASE_TABLET | Freq: Every day | ORAL | 0 refills | Status: DC

## 2020-09-14 NOTE — Telephone Encounter (Signed)
Pt needs CONCERTA 36 MG CR tablet insurance needs prior authorization or needs generic sent to [place], [place] - [address]   Pt call back [phone]

## 2020-09-14 NOTE — Telephone Encounter (Signed)
Yes, i'm aware, pt wanted it sent as brand name to see if he could get this with his insurance.

## 2020-09-14 NOTE — Telephone Encounter (Signed)
Can this be resent with generic. The first script sent has a note for name brand only but the other two are for generic. Pt states insurance requires pa for name brand or can send in generic.

## 2020-09-14 NOTE — Progress Notes (Signed)
Subjective:    Patient ID: Henry Hughes, male    DOB: [birth year], 25 y.o.   MRN: [id number]  HPI Patient was seen today for ADD checkup.  This patient does have ADD.  Patient takes medications for this.  If this does help control overall symptoms.  Please see below. -weight, vital signs reviewed.  The following items were covered. -Compliance with medication : Concerta 36 mg   -Problems with completing homework, paying attention/taking good notes in school: none  -grades: pretty good  - Eating patterns : eating well  -sleeping: no issues  -Additional issues or questions: pt would like to try name brand Concerta   Pt didn't see the psychiatrist or counselor.  And just used the klonapin prn. And pt was trying to handle things on their.  Things better with relationship.  Was given brand name in past in past, but couldn't afford it at the time and wanting to go back to this type of medication again.   Review of Systems  Constitutional: Negative for chills and fever.  HENT: Negative for congestion, rhinorrhea and sore throat.   Respiratory: Negative for cough, shortness of breath and wheezing.   Cardiovascular: Negative for chest pain, palpitations and leg swelling.  Gastrointestinal: Negative for abdominal pain, diarrhea, nausea and vomiting.  Genitourinary: Negative for dysuria and frequency.  Skin: Negative for rash.  Neurological: Negative for dizziness, weakness and headaches.  Psychiatric/Behavioral: Negative for behavioral problems, decreased concentration, dysphoric mood, self-injury, sleep disturbance and suicidal ideas. The patient is not nervous/anxious and is not hyperactive.     Vitals:   09/14/20 1025  BP: 134/88  Pulse: 69  Temp: 97.9 F (36.6 C)  SpO2: 98%       Objective:   Physical Exam Vitals and nursing note reviewed.  Constitutional:      General: He is not in acute distress.    Appearance: Normal appearance. He is not ill-appearing.   HENT:     Head: Normocephalic.     Nose: Nose normal. No congestion.     Mouth/Throat:     Mouth: Mucous membranes are moist.     Pharynx: No oropharyngeal exudate.  Eyes:     Extraocular Movements: Extraocular movements intact.     Conjunctiva/sclera: Conjunctivae normal.     Pupils: Pupils are equal, round, and reactive to light.  Cardiovascular:     Rate and Rhythm: Normal rate and regular rhythm.     Pulses: Normal pulses.     Heart sounds: Normal heart sounds. No murmur heard.   Pulmonary:     Effort: Pulmonary effort is normal.     Breath sounds: Normal breath sounds. No wheezing, rhonchi or rales.  Musculoskeletal:        General: Normal range of motion.     Right lower leg: No edema.     Left lower leg: No edema.  Skin:    General: Skin is warm and dry.     Findings: No rash.  Neurological:     General: No focal deficit present.     Mental Status: He is alert and oriented to person, place, and time.     Cranial Nerves: No cranial nerve deficit.  Psychiatric:        Mood and Affect: Mood normal.        Behavior: Behavior normal.        Thought Content: Thought content normal.        Judgment: Judgment normal.  Assessment & Plan:   1. Attention deficit hyperactivity disorder (ADHD), predominantly inattentive type - methylphenidate (CONCERTA) 36 MG PO CR tablet; Take 1 tablet (36 mg total) by mouth every morning.  Dispense: 30 tablet; Refill: 0 - CONCERTA 36 MG CR tablet; Take 1 tablet (36 mg total) by mouth daily. Dispense brand name.  Dispense: 30 tablet; Refill: 0 - methylphenidate (CONCERTA) 36 MG PO CR tablet; Take 1 tablet (36 mg total) by mouth daily.  Dispense: 30 tablet; Refill: 0   BP Readings from Last 3 Encounters:  09/14/20 134/88  05/18/20 122/82  03/31/20 126/78   Pt doing well and medications are controlling symptoms. Cont to monitor bp.    Will cont meds.  F/u 3 mo or prn.

## 2020-09-14 NOTE — Telephone Encounter (Signed)
Medication resent in as generic by provider. Patient notified.

## 2020-09-23 ENCOUNTER — Other Ambulatory Visit: Payer: Self-pay | Admitting: *Deleted

## 2020-09-23 DIAGNOSIS — Z114 Encounter for screening for human immunodeficiency virus [HIV]: Secondary | ICD-10-CM

## 2020-09-23 NOTE — Progress Notes (Signed)
HIV

## 2020-10-13 LAB — HIV ANTIBODY (ROUTINE TESTING W REFLEX): HIV Screen 4th Generation wRfx: NONREACTIVE

## 2020-10-21 ENCOUNTER — Ambulatory Visit: Payer: Self-pay

## 2020-11-04 ENCOUNTER — Other Ambulatory Visit: Payer: Self-pay

## 2020-11-04 ENCOUNTER — Ambulatory Visit: Payer: PRIVATE HEALTH INSURANCE

## 2020-11-04 DIAGNOSIS — Z23 Encounter for immunization: Secondary | ICD-10-CM

## 2020-11-04 NOTE — Progress Notes (Unsigned)
Patient arrived for his 3rd HPV vaccine, given in R deltoid tolerated well .

## 2020-11-27 ENCOUNTER — Telehealth: Payer: Self-pay | Admitting: Family Medicine

## 2020-11-27 NOTE — Telephone Encounter (Signed)
Patient has recently relocated to [place], he called [facility] in [place] to see if they could transfer his Concerta and they told him that they weren't able to transfer that medication. He is asking for 1 month to be called into [facility] in  [place].   CB# [phone]

## 2020-11-30 NOTE — Telephone Encounter (Signed)
Pt is out of the medication, pt would like a call back once this is completed

## 2020-11-30 NOTE — Telephone Encounter (Signed)
Please advise. Thank you

## 2020-11-30 NOTE — Telephone Encounter (Signed)
Pt contacted and verbalized understanding.  

## 2024-07-10 ENCOUNTER — Ambulatory Visit: Admitting: Family Medicine

## 2024-07-12 ENCOUNTER — Ambulatory Visit: Admitting: Family Medicine

## 2024-07-12 ENCOUNTER — Encounter: Payer: Self-pay | Admitting: Family Medicine

## 2024-07-12 VITALS — BP 132/86 | HR 75 | Ht 66.0 in | Wt 204.0 lb

## 2024-07-12 DIAGNOSIS — F909 Attention-deficit hyperactivity disorder, unspecified type: Secondary | ICD-10-CM

## 2024-07-12 NOTE — Progress Notes (Signed)
 "  New Patient Office Visit  Subjective    Patient ID: Henry Hughes, male    DOB: [birth year]  Age: 29 y.o. MRN: [id number]  CC:  Chief Complaint  Patient presents with   Establish Care    HPI Henry Hughes presents to establish care Discussed the use of AI scribe software for clinical note transcription with the patient, who gave verbal consent to proceed.  History of Present Illness   Henry Hughes is a 29 year old male who presents for ADHD medication management.  He has been taking Concerta  54 mg daily for ADHD, which he previously received from a psychiatrist in [place]. He recently moved and needs to establish care locally to continue his medication. He has not yet used the local pharmacy and needs to provide records from his previous psychiatrist.  He also takes clonidine 0.2 mg as well for ADHD. He is not currently taking any other medications and has no known allergies.  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      Henry Hughes is a 29 year old male who presents for ADHD medication management.  He has been taking Concerta  54 mg daily for ADHD, which he previously received from a psychiatrist in [place]. He recently moved and needs to establish care locally to continue his medication. He has not yet used the local pharmacy and needs to provide records from his previous psychiatrist.  He also takes clonidine 0.2 mg for ADHD, which helps with sleep. He is not currently taking any other medications and has no known allergies.  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     Outpatient Encounter Medications as of 07/12/2024  Medication Sig   methylphenidate  54 MG PO CR tablet Take 54 mg by mouth every morning.   [DISCONTINUED] CONCERTA  36 MG CR tablet Take 1 tablet (36 mg total) by mouth daily. Dispense brand name.   [DISCONTINUED] methylphenidate  (CONCERTA ) 36 MG PO CR tablet Take 1 tablet (36 mg total) by mouth every  morning.   [DISCONTINUED] methylphenidate  (CONCERTA ) 36 MG PO CR tablet Take 1 tablet (36 mg total) by mouth daily.   No facility-administered encounter medications on file as of 07/12/2024.    History reviewed. No pertinent past medical history.  History reviewed. No pertinent surgical history.  History reviewed. No pertinent family history.  Social History   Socioeconomic History   Marital status: Single    Spouse name: Not on file   Number of children: Not on file   Years of education: Not on file   Highest education level: Not on file  Occupational History   Not on file  Tobacco Use   Smoking status: Never   Smokeless tobacco: Never  Vaping Use   Vaping status: Not on file  Substance and Sexual Activity   Alcohol use: Never   Drug use: Never   Sexual activity: Yes    Birth control/protection: Condom  Other Topics Concern   Not on file  Social History Narrative   Not on file   Social Drivers of Health   Tobacco Use: Low Risk (07/12/2024)   Patient History    Smoking Tobacco Use: Never    Smokeless Tobacco Use: Never    Passive Exposure: Not on file  Financial Resource Strain: Not on file  Food Insecurity: Not on file  Transportation Needs: Not on file  Physical Activity: Not on file  Stress: Not on file  Social Connections:  Unknown (10/19/2021)   Received from [facility]   Social Network    Social Network: Not on file  Intimate Partner Violence: Unknown (09/10/2021)   Received from [facility]   HITS    Physically Hurt: Not on file    Insult or Talk Down To: Not on file    Threaten Physical Harm: Not on file    Scream or Curse: Not on file  Depression (PHQ2-9): Low Risk (07/12/2024)   Depression (PHQ2-9)    PHQ-2 Score: 4  Alcohol Screen: Not on file  Housing: Not on file  Utilities: Not on file  Health Literacy: Not on file       Objective   BP 132/86   Pulse 75   Ht 5' 6 (1.676 m)   Wt 204 lb (92.5 kg)   SpO2 98%   BMI 32.93 kg/m     Physical Exam Vitals reviewed.  Constitutional:      Appearance: Normal appearance.  HENT:     Head: Normocephalic.  Eyes:     General:        Right eye: No discharge.        Left eye: No discharge.  Cardiovascular:     Rate and Rhythm: Normal rate.  Pulmonary:     Effort: Pulmonary effort is normal.  Neurological:     Mental Status: He is alert and oriented to person, place, and time.  Psychiatric:        Mood and Affect: Mood normal.        Behavior: Behavior normal.        Thought Content: Thought content normal.        Judgment: Judgment normal.            The ASCVD Risk score (Arnett DK, et al., 2019) failed to calculate for the following reasons:   The 2019 ASCVD risk score is only valid for ages 74 to 103   * - Cholesterol units were assumed     Assessment & Plan:  Attention deficit hyperactivity disorder (ADHD), unspecified ADHD type Assessment & Plan: Advised that I would need records from his psychiatrist concerning his ADHD treatment before I would prescribe Concerta  for him.  Advised that it might take 2 weeks to get the information processed.  Patient became angry and declined to sign a ROI.  Again advised that I would not write a controlled medication without substantiating records.  Patient stated that he would go somewhere else to get his care.       No follow-ups on file.   Cayce Quezada K Jaleeya Mcnelly, MD  "

## 2024-07-12 NOTE — Assessment & Plan Note (Addendum)
 Advised that I would need records from his psychiatrist concerning his ADHD treatment before I would prescribe Concerta  for him.  Advised that it might take 2 weeks to get the information processed.  Patient became angry and declined to sign a ROI.  Again advised that I would not write a controlled medication without substantiating records.  Patient stated that he would go somewhere else to get his care.
